# Patient Record
Sex: Female | Born: 1950 | Race: White | Hispanic: No | Smoking: Never smoker
Health system: Southern US, Community
[De-identification: ages and names within clinical notes are randomized; demographics above are authoritative.]

## PROBLEM LIST (undated history)

## (undated) DIAGNOSIS — R51 Headache: Secondary | ICD-10-CM

## (undated) DIAGNOSIS — R112 Nausea with vomiting, unspecified: Secondary | ICD-10-CM

## (undated) DIAGNOSIS — Z9889 Other specified postprocedural states: Secondary | ICD-10-CM

## (undated) DIAGNOSIS — Z1211 Encounter for screening for malignant neoplasm of colon: Secondary | ICD-10-CM

## (undated) DIAGNOSIS — L309 Dermatitis, unspecified: Secondary | ICD-10-CM

## (undated) DIAGNOSIS — R519 Headache, unspecified: Secondary | ICD-10-CM

## (undated) DIAGNOSIS — T8859XA Other complications of anesthesia, initial encounter: Secondary | ICD-10-CM

## (undated) DIAGNOSIS — Z1389 Encounter for screening for other disorder: Secondary | ICD-10-CM

## (undated) DIAGNOSIS — E559 Vitamin D deficiency, unspecified: Secondary | ICD-10-CM

## (undated) DIAGNOSIS — N6019 Diffuse cystic mastopathy of unspecified breast: Secondary | ICD-10-CM

## (undated) DIAGNOSIS — Z13220 Encounter for screening for lipoid disorders: Secondary | ICD-10-CM

## (undated) DIAGNOSIS — K649 Unspecified hemorrhoids: Secondary | ICD-10-CM

## (undated) DIAGNOSIS — I480 Paroxysmal atrial fibrillation: Secondary | ICD-10-CM

## (undated) DIAGNOSIS — R739 Hyperglycemia, unspecified: Secondary | ICD-10-CM

## (undated) DIAGNOSIS — N63 Unspecified lump in unspecified breast: Secondary | ICD-10-CM

## (undated) DIAGNOSIS — M1990 Unspecified osteoarthritis, unspecified site: Secondary | ICD-10-CM

## (undated) DIAGNOSIS — C801 Malignant (primary) neoplasm, unspecified: Secondary | ICD-10-CM

## (undated) DIAGNOSIS — K219 Gastro-esophageal reflux disease without esophagitis: Secondary | ICD-10-CM

## (undated) DIAGNOSIS — Z87440 Personal history of urinary (tract) infections: Secondary | ICD-10-CM

## (undated) DIAGNOSIS — R7303 Prediabetes: Secondary | ICD-10-CM

## (undated) DIAGNOSIS — I639 Cerebral infarction, unspecified: Secondary | ICD-10-CM

## (undated) DIAGNOSIS — G4730 Sleep apnea, unspecified: Secondary | ICD-10-CM

## (undated) DIAGNOSIS — E119 Type 2 diabetes mellitus without complications: Secondary | ICD-10-CM

## (undated) DIAGNOSIS — I10 Essential (primary) hypertension: Secondary | ICD-10-CM

## (undated) DIAGNOSIS — I499 Cardiac arrhythmia, unspecified: Secondary | ICD-10-CM

## (undated) DIAGNOSIS — D126 Benign neoplasm of colon, unspecified: Secondary | ICD-10-CM

## (undated) DIAGNOSIS — G43909 Migraine, unspecified, not intractable, without status migrainosus: Secondary | ICD-10-CM

## (undated) DIAGNOSIS — E785 Hyperlipidemia, unspecified: Secondary | ICD-10-CM

## (undated) DIAGNOSIS — Z803 Family history of malignant neoplasm of breast: Secondary | ICD-10-CM

## (undated) HISTORY — DX: Other specified postprocedural states: Z98.890

## (undated) HISTORY — DX: Gastro-esophageal reflux disease without esophagitis: K21.9

## (undated) HISTORY — DX: Essential (primary) hypertension: I10

## (undated) HISTORY — PX: CYSTECTOMY: SUR359

## (undated) HISTORY — DX: Encounter for screening for other disorder: Z13.89

## (undated) HISTORY — PX: BREAST BIOPSY: SHX20

## (undated) HISTORY — PX: LIPOMA EXCISION: SHX5283

## (undated) HISTORY — DX: Personal history of urinary (tract) infections: Z87.440

## (undated) HISTORY — DX: Family history of malignant neoplasm of breast: Z80.3

## (undated) HISTORY — DX: Diffuse cystic mastopathy of unspecified breast: N60.19

## (undated) HISTORY — PX: KNEE ARTHROSCOPY: SUR90

## (undated) HISTORY — PX: FOOT SURGERY: SHX648

## (undated) HISTORY — DX: Encounter for screening for malignant neoplasm of colon: Z12.11

## (undated) HISTORY — PX: BUNIONECTOMY: SHX129

## (undated) HISTORY — PX: WISDOM TOOTH EXTRACTION: SHX21

## (undated) HISTORY — PX: CYST REMOVAL NECK: SHX6281

## (undated) HISTORY — PX: BREAST SURGERY: SHX581

## (undated) HISTORY — DX: Encounter for screening for lipoid disorders: Z13.220

---
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
## 1988-04-18 DIAGNOSIS — I10 Essential (primary) hypertension: Secondary | ICD-10-CM

## 1988-04-18 HISTORY — DX: Essential (primary) hypertension: I10

## 2004-08-05 ENCOUNTER — Ambulatory Visit: Payer: Self-pay | Admitting: Internal Medicine

## 2005-04-18 HISTORY — PX: COLONOSCOPY: SHX174

## 2009-04-18 HISTORY — PX: BREAST BIOPSY: SHX20

## 2009-04-18 HISTORY — PX: KNEE SURGERY: SHX244

## 2010-03-05 ENCOUNTER — Ambulatory Visit: Payer: Self-pay | Admitting: Anesthesiology

## 2010-03-08 ENCOUNTER — Ambulatory Visit: Payer: Self-pay | Admitting: Orthopedic Surgery

## 2010-04-18 DIAGNOSIS — Z9889 Other specified postprocedural states: Secondary | ICD-10-CM

## 2010-04-18 HISTORY — DX: Other specified postprocedural states: Z98.890

## 2010-05-19 ENCOUNTER — Ambulatory Visit: Payer: Self-pay | Admitting: General Surgery

## 2010-11-08 ENCOUNTER — Ambulatory Visit: Payer: Self-pay | Admitting: General Surgery

## 2010-12-17 ENCOUNTER — Ambulatory Visit: Payer: Self-pay | Admitting: General Surgery

## 2010-12-21 LAB — PATHOLOGY REPORT

## 2011-08-12 ENCOUNTER — Ambulatory Visit: Payer: Self-pay | Admitting: General Surgery

## 2011-08-15 LAB — PATHOLOGY REPORT

## 2011-11-09 ENCOUNTER — Ambulatory Visit: Payer: Self-pay | Admitting: General Surgery

## 2012-06-08 ENCOUNTER — Encounter: Payer: Self-pay | Admitting: *Deleted

## 2012-06-08 DIAGNOSIS — I10 Essential (primary) hypertension: Secondary | ICD-10-CM | POA: Insufficient documentation

## 2012-07-26 ENCOUNTER — Ambulatory Visit (INDEPENDENT_AMBULATORY_CARE_PROVIDER_SITE_OTHER): Payer: BC Managed Care – PPO | Admitting: General Surgery

## 2012-07-26 ENCOUNTER — Encounter: Payer: Self-pay | Admitting: General Surgery

## 2012-07-26 VITALS — BP 104/66 | HR 62 | Resp 12 | Ht 63.0 in | Wt 237.0 lb

## 2012-07-26 DIAGNOSIS — K219 Gastro-esophageal reflux disease without esophagitis: Secondary | ICD-10-CM

## 2012-07-26 DIAGNOSIS — D131 Benign neoplasm of stomach: Secondary | ICD-10-CM | POA: Insufficient documentation

## 2012-07-26 DIAGNOSIS — Z803 Family history of malignant neoplasm of breast: Secondary | ICD-10-CM | POA: Insufficient documentation

## 2012-07-26 NOTE — Progress Notes (Signed)
Prior endoscopy has Patient ID: Jane Moran, female   DOB: 08-Jun-1950, 62 y.o.   MRN: [id number]  Chief Complaint  Patient presents with  . Other    gerd    HPI Jane Moran is a 62 y.o. female here today following up from gerd.No new problems.Prior endiscopy has shown multiple FGPs. Pt is doing ok with Zegerid at present HPI  Past Medical History  Diagnosis Date  . Hypertension 1990  . Esophageal reflux   . Diffuse cystic mastopathy   . Screening cholesterol level     Past Surgical History  Procedure Laterality Date  . Colonoscopy  2007  . Knee surgery Left 2011  . Bunionectomy  1972  . Cyst removal neck  4098,1191    Family History  Problem Relation Age of Onset  . Breast cancer Mother   . Lung cancer Father   . Prostate cancer Maternal Grandfather   . Breast cancer Paternal Aunt     Social History History  Substance Use Topics  . Smoking status: Never Smoker   . Smokeless tobacco: Never Used  . Alcohol Use: No    Allergies  Allergen Reactions  . Codeine Nausea Only  . Zinc Oxide Rash    Current Outpatient Prescriptions  Medication Sig Dispense Refill  . aspirin-acetaminophen-caffeine (EXCEDRIN MIGRAINE) 250-250-65 MG per tablet Take 1 tablet by mouth every 6 (six) hours as needed for pain.      . Cholecalciferol (D3 DOTS) 2000 UNITS TBDP Take by mouth.      . fenoprofen (NALFON) 600 MG TABS Take by mouth.      Marland Kitchen lisinopril-hydrochlorothiazide (PRINZIDE,ZESTORETIC) 20-25 MG per tablet Take 1 tablet by mouth daily.      Maxwell Caul Bicarbonate (ZEGERID) 20-1100 MG CAPS Take 1 capsule by mouth daily before breakfast.       No current facility-administered medications for this visit.    Review of Systems Review of Systems  Constitutional: Negative.   Respiratory: Negative.   Cardiovascular: Negative.     Blood pressure 104/66, pulse 62, resp. rate 12, height 5\' 3"  (1.6 m), weight 237 lb (107.502 kg).  Physical Exam Physical Exam   Constitutional: She appears well-developed and well-nourished.  Eyes: Conjunctivae are normal. No scleral icterus.  Neck: Trachea normal. No mass and no thyromegaly present.  Cardiovascular: Normal rate, regular rhythm, normal heart sounds and normal pulses.   No murmur heard. Pulmonary/Chest: Effort normal and breath sounds normal.  Abdominal: Soft. Normal appearance and bowel sounds are normal. There is no hepatosplenomegaly. There is no tenderness. No hernia.    Data Reviewed none  Assessment    GERD , stable and unde control with Zegerid     Plan    Continue with current med, discussed other measures to control heartburn.        Lalania Haseman G 07/27/2012, 6:15 AM

## 2012-07-26 NOTE — Patient Instructions (Addendum)
Patient to return for follow up in July 2014 after having bilateral screening mammogram. Continue use of Zegrid for reflux., keeping HO B elevated and avoid eating for 2-3 hrs prior to going to bed .

## 2012-07-27 ENCOUNTER — Encounter: Payer: Self-pay | Admitting: General Surgery

## 2012-08-27 ENCOUNTER — Encounter: Payer: Self-pay | Admitting: *Deleted

## 2012-11-09 ENCOUNTER — Ambulatory Visit: Payer: Self-pay | Admitting: General Surgery

## 2012-11-12 ENCOUNTER — Encounter: Payer: Self-pay | Admitting: General Surgery

## 2012-11-20 ENCOUNTER — Encounter: Payer: Self-pay | Admitting: General Surgery

## 2012-11-20 ENCOUNTER — Ambulatory Visit (INDEPENDENT_AMBULATORY_CARE_PROVIDER_SITE_OTHER): Payer: BC Managed Care – PPO | Admitting: General Surgery

## 2012-11-20 VITALS — BP 110/70 | HR 80 | Resp 16 | Ht 63.0 in | Wt 241.0 lb

## 2012-11-20 DIAGNOSIS — K219 Gastro-esophageal reflux disease without esophagitis: Secondary | ICD-10-CM

## 2012-11-20 DIAGNOSIS — N6019 Diffuse cystic mastopathy of unspecified breast: Secondary | ICD-10-CM

## 2012-11-20 DIAGNOSIS — D131 Benign neoplasm of stomach: Secondary | ICD-10-CM

## 2012-11-20 DIAGNOSIS — Z803 Family history of malignant neoplasm of breast: Secondary | ICD-10-CM

## 2012-11-20 NOTE — Progress Notes (Signed)
Patient ID: Jane Moran, female   DOB: Jan 25, 1951, 62 y.o.   MRN: [id number]  Chief Complaint  Patient presents with  . Follow-up    mammogram and GERD    HPI Jane Moran is a 62 y.o. female.  who presents for her annual breast evaluation. The most recent mammogram was done on 11-09-12.  Patient does perform regular self breast checks and gets regular mammograms done.  No new breast issues. She has been using the Zegerid and the reflux seems to be under control.   HPI  Past Medical History  Diagnosis Date  . Esophageal reflux   . Diffuse cystic mastopathy   . Screening cholesterol level   . Hypertension 1990  . H/O endoscopy 2012  . H/O cystitis   . Special screening for malignant neoplasms, colon   . Screening for obesity   . Family history of malignant neoplasm of breast     Past Surgical History  Procedure Laterality Date  . Colonoscopy  2007  . Knee surgery Left 2011  . Bunionectomy  1972  . Cyst removal neck  [id number]    Family History  Problem Relation Age of Onset  . Breast cancer Mother   . Lung cancer Father   . Prostate cancer Maternal Grandfather   . Breast cancer Paternal Aunt   . Breast cancer Cousin     Social History History  Substance Use Topics  . Smoking status: Never Smoker   . Smokeless tobacco: Never Used  . Alcohol Use: No    Allergies  Allergen Reactions  . Codeine Nausea Only  . Zinc Oxide Rash    Current Outpatient Prescriptions  Medication Sig Dispense Refill  . aspirin-acetaminophen-caffeine (EXCEDRIN MIGRAINE) 250-250-65 MG per tablet Take 1 tablet by mouth every 6 (six) hours as needed for pain.      . Cholecalciferol (VITAMIN D) 2000 UNITS CAPS Take 1 capsule by mouth daily.      Marland Kitchen lisinopril-hydrochlorothiazide (PRINZIDE,ZESTORETIC) 20-25 MG per tablet Take 1 tablet by mouth daily.      Maxwell Caul Bicarbonate (ZEGERID) 20-1100 MG CAPS Take 1 capsule by mouth daily before breakfast.       No current  facility-administered medications for this visit.    Review of Systems Review of Systems  Constitutional: Negative.   Respiratory: Negative.   Cardiovascular: Negative.     Blood pressure 110/70, pulse 80, resp. rate 16, height 5\' 3"  (1.6 m), weight 241 lb (109.317 kg).  Physical Exam Physical Exam  Constitutional: She is oriented to person, place, and time. She appears well-developed and well-nourished.  Eyes: Conjunctivae are normal.  Neck: Neck supple. No thyromegaly present.  Cardiovascular: Normal rate and regular rhythm.   Pulmonary/Chest: Effort normal and breath sounds normal. Right breast exhibits no inverted nipple, no mass, no nipple discharge, no skin change and no tenderness. Left breast exhibits no inverted nipple, no mass, no nipple discharge, no skin change and no tenderness.  Lymphadenopathy:    She has no cervical adenopathy.    She has no axillary adenopathy.  Neurological: She is alert and oriented to person, place, and time.  Skin: Skin is warm and dry.    Data Reviewed Mammogram  Assessment    Stable breast exam. GERD under control.    Plan    Patient to return in one year for bilateral screening mammogram. May use over the counter Nexium.       SANKAR,SEEPLAPUTHUR G 11/20/2012, 7:40 PM

## 2012-11-20 NOTE — Patient Instructions (Addendum)
Continue self breast exams. Call office for any new breast issues or concerns. Nexium is OK to use

## 2013-12-02 ENCOUNTER — Ambulatory Visit: Payer: BC Managed Care – PPO | Admitting: General Surgery

## 2013-12-10 ENCOUNTER — Ambulatory Visit: Payer: Self-pay | Admitting: General Surgery

## 2013-12-12 ENCOUNTER — Encounter: Payer: Self-pay | Admitting: General Surgery

## 2013-12-16 ENCOUNTER — Ambulatory Visit (INDEPENDENT_AMBULATORY_CARE_PROVIDER_SITE_OTHER): Payer: BC Managed Care – PPO | Admitting: General Surgery

## 2013-12-16 ENCOUNTER — Encounter: Payer: Self-pay | Admitting: General Surgery

## 2013-12-16 VITALS — BP 130/74 | HR 74 | Resp 14 | Ht 63.0 in | Wt 239.0 lb

## 2013-12-16 DIAGNOSIS — N6019 Diffuse cystic mastopathy of unspecified breast: Secondary | ICD-10-CM

## 2013-12-16 DIAGNOSIS — Z803 Family history of malignant neoplasm of breast: Secondary | ICD-10-CM

## 2013-12-16 NOTE — Progress Notes (Signed)
Patient ID: JAZZELLE ZHANG, female   DOB: 1950/10/30, 63 y.o.   MRN: [id number]  Chief Complaint  Patient presents with  . Follow-up    mammogram    HPI Jane Moran is a 63 y.o. female who presents for a breast evaluation. The most recent mammogram was done on 12/10/13.  Patient does perform regular self breast checks and gets regular mammograms done.  No breast complaints. She does have a small skin cyst in right axilla that has been bothersome.  HPI  Past Medical History  Diagnosis Date  . Esophageal reflux   . Diffuse cystic mastopathy   . Screening cholesterol level   . Hypertension 1990  . H/O endoscopy 2012  . H/O cystitis   . Special screening for malignant neoplasms, colon   . Screening for obesity   . Family history of malignant neoplasm of breast     Past Surgical History  Procedure Laterality Date  . Colonoscopy  2007  . Knee surgery Left 2011  . Bunionectomy  1972  . Cyst removal neck  1448,1856    Family History  Problem Relation Age of Onset  . Breast cancer Mother   . Lung cancer Father   . Prostate cancer Maternal Grandfather   . Breast cancer Paternal Aunt   . Breast cancer Cousin     Social History History  Substance Use Topics  . Smoking status: Never Smoker   . Smokeless tobacco: Never Used  . Alcohol Use: No    Allergies  Allergen Reactions  . Codeine Nausea Only  . Zinc Oxide Rash    Current Outpatient Prescriptions  Medication Sig Dispense Refill  . aspirin-acetaminophen-caffeine (EXCEDRIN MIGRAINE) 250-250-65 MG per tablet Take 1 tablet by mouth every 6 (six) hours as needed for pain.      . Cholecalciferol (VITAMIN D) 2000 UNITS CAPS Take 1 capsule by mouth daily. 400mg       . dexlansoprazole (DEXILANT) 60 MG capsule Take 60 mg by mouth daily.      Marland Kitchen lisinopril-hydrochlorothiazide (PRINZIDE,ZESTORETIC) 20-25 MG per tablet Take 1 tablet by mouth daily.       No current facility-administered medications for this visit.    Review  of Systems Review of Systems  Constitutional: Negative.   Respiratory: Negative.   Cardiovascular: Negative.     Blood pressure 130/74, pulse 74, resp. rate 14, height 5\' 3"  (1.6 m), weight 239 lb (108.41 kg).  Physical Exam Physical Exam  Constitutional: She is oriented to person, place, and time. She appears well-developed and well-nourished.  Eyes: Conjunctivae are normal. No scleral icterus.  Neck: Neck supple. No mass and no thyromegaly present.  Cardiovascular: Normal rate, regular rhythm and normal heart sounds.   Pulmonary/Chest: Effort normal and breath sounds normal. Right breast exhibits no inverted nipple, no mass, no nipple discharge, no skin change and no tenderness. Left breast exhibits no inverted nipple, no mass, no nipple discharge, no skin change and no tenderness.  Abdominal: Soft. Bowel sounds are normal. There is no tenderness.  Lymphadenopathy:    She has no cervical adenopathy.    She has no axillary adenopathy.  Neurological: She is alert and oriented to person, place, and time.  Skin: Skin is warm and dry.  6 mm skin cyst in the right axilla.     Data Reviewed Mammogram reviewed and stable.   Assessment    Stable exam. FH of breast cancer     Plan    The patient has been asked to  return to the office in one year with a bilateral diagnostic mammogram.     Schedule excision of skin cyst right axilla    Iren Whipp G 12/17/2013, 4:22 PM

## 2013-12-17 ENCOUNTER — Encounter: Payer: Self-pay | Admitting: General Surgery

## 2013-12-17 NOTE — Patient Instructions (Signed)
Continue monthly self breast exam. Call for any problems.

## 2013-12-30 ENCOUNTER — Ambulatory Visit: Payer: BC Managed Care – PPO | Admitting: General Surgery

## 2014-01-07 ENCOUNTER — Ambulatory Visit (INDEPENDENT_AMBULATORY_CARE_PROVIDER_SITE_OTHER): Payer: BC Managed Care – PPO | Admitting: General Surgery

## 2014-01-07 ENCOUNTER — Encounter: Payer: Self-pay | Admitting: General Surgery

## 2014-01-07 VITALS — BP 118/78 | HR 90 | Resp 12 | Ht 62.0 in | Wt 237.0 lb

## 2014-01-07 DIAGNOSIS — R229 Localized swelling, mass and lump, unspecified: Secondary | ICD-10-CM

## 2014-01-07 DIAGNOSIS — R2231 Localized swelling, mass and lump, right upper limb: Secondary | ICD-10-CM

## 2014-01-07 NOTE — Progress Notes (Signed)
Here today for planned excision right axilla skin cyst.  Area infiltrated with 6 ml of 1% xylocaine mixed with 0.5% marcaine. Prepped with Chloro prep. Elliptical excision of the cyst performed. Excised diameter 2cm. leeding controlled with cautery. Skin closed with 4-0 nylon interrupted stitches. No immediate problems from procedure. Marland Kitchen

## 2014-01-07 NOTE — Patient Instructions (Addendum)
Keep area clean May use ice to the area on and off today

## 2014-01-09 ENCOUNTER — Telehealth: Payer: Self-pay | Admitting: *Deleted

## 2014-01-09 LAB — PATHOLOGY

## 2014-01-09 NOTE — Telephone Encounter (Signed)
Notified patient husband as instructed, patient husband pleased. Discussed follow-up appointments, patient husband agrees

## 2014-01-14 ENCOUNTER — Ambulatory Visit (INDEPENDENT_AMBULATORY_CARE_PROVIDER_SITE_OTHER): Payer: Self-pay | Admitting: *Deleted

## 2014-01-14 DIAGNOSIS — R2231 Localized swelling, mass and lump, right upper limb: Secondary | ICD-10-CM

## 2014-01-14 DIAGNOSIS — R229 Localized swelling, mass and lump, unspecified: Secondary | ICD-10-CM

## 2014-01-14 NOTE — Patient Instructions (Signed)
Patient to return as needed. The patient is aware to call back for any questions or concerns. 

## 2014-01-14 NOTE — Progress Notes (Signed)
The sutures were removed and steri strips applied. Patient aware of pathology results.   

## 2014-02-17 ENCOUNTER — Encounter: Payer: Self-pay | Admitting: General Surgery

## 2014-09-18 ENCOUNTER — Other Ambulatory Visit: Payer: Self-pay | Admitting: Internal Medicine

## 2014-09-18 DIAGNOSIS — Z1231 Encounter for screening mammogram for malignant neoplasm of breast: Secondary | ICD-10-CM

## 2014-11-12 ENCOUNTER — Encounter: Payer: Self-pay | Admitting: *Deleted

## 2014-11-13 ENCOUNTER — Encounter
Admission: RE | Disposition: A | Discharge status: Home / Self Care | Payer: Self-pay | Source: Ambulatory Visit | Attending: Gastroenterology

## 2014-11-13 ENCOUNTER — Ambulatory Visit: Payer: BLUE CROSS/BLUE SHIELD | Admitting: Anesthesiology

## 2014-11-13 ENCOUNTER — Ambulatory Visit
Admission: RE | Admit: 2014-11-13 | Discharge: 2014-11-13 | Disposition: A | Discharge status: Home / Self Care | Payer: BLUE CROSS/BLUE SHIELD | Source: Ambulatory Visit | Attending: Gastroenterology | Admitting: Gastroenterology

## 2014-11-13 DIAGNOSIS — Z1211 Encounter for screening for malignant neoplasm of colon: Secondary | ICD-10-CM | POA: Insufficient documentation

## 2014-11-13 DIAGNOSIS — D125 Benign neoplasm of sigmoid colon: Secondary | ICD-10-CM | POA: Diagnosis not present

## 2014-11-13 DIAGNOSIS — K219 Gastro-esophageal reflux disease without esophagitis: Secondary | ICD-10-CM | POA: Insufficient documentation

## 2014-11-13 DIAGNOSIS — K648 Other hemorrhoids: Secondary | ICD-10-CM | POA: Insufficient documentation

## 2014-11-13 DIAGNOSIS — Z8371 Family history of colonic polyps: Secondary | ICD-10-CM | POA: Diagnosis not present

## 2014-11-13 DIAGNOSIS — I10 Essential (primary) hypertension: Secondary | ICD-10-CM | POA: Diagnosis not present

## 2014-11-13 HISTORY — DX: Unspecified osteoarthritis, unspecified site: M19.90

## 2014-11-13 HISTORY — PX: COLONOSCOPY WITH PROPOFOL: SHX5780

## 2014-11-13 HISTORY — DX: Headache, unspecified: R51.9

## 2014-11-13 HISTORY — DX: Cardiac arrhythmia, unspecified: I49.9

## 2014-11-13 HISTORY — DX: Headache: R51

## 2014-11-13 SURGERY — COLONOSCOPY WITH PROPOFOL
Anesthesia: General

## 2014-11-13 MED ORDER — MIDAZOLAM HCL 5 MG/5ML IJ SOLN
INTRAMUSCULAR | Status: DC | PRN
  Administered 2014-11-13: 2 mg via INTRAVENOUS

## 2014-11-13 MED ORDER — PROPOFOL INFUSION 10 MG/ML OPTIME
INTRAVENOUS | Status: DC | PRN
  Administered 2014-11-13: 160 ug/kg/min via INTRAVENOUS

## 2014-11-13 MED ORDER — SODIUM CHLORIDE 0.9 % IV SOLN: INTRAVENOUS | Status: DC

## 2014-11-13 MED ORDER — PROPOFOL 10 MG/ML IV BOLUS
INTRAVENOUS | Status: DC | PRN
  Administered 2014-11-13 (×2): 40 mg via INTRAVENOUS

## 2014-11-13 MED ORDER — SODIUM CHLORIDE 0.9 % IV SOLN
INTRAVENOUS | Status: DC
  Administered 2014-11-13: 09:00:00 via INTRAVENOUS
  Administered 2014-11-13: 1000 mL via INTRAVENOUS

## 2014-11-13 MED ORDER — FENTANYL CITRATE (PF) 100 MCG/2ML IJ SOLN
INTRAMUSCULAR | Status: DC | PRN
  Administered 2014-11-13: 50 ug via INTRAVENOUS

## 2014-11-13 NOTE — Anesthesia Postprocedure Evaluation (Signed)
  Anesthesia Post-op Note  Patient: Jane Moran  Procedure(s) Performed: Procedure(s): COLONOSCOPY WITH PROPOFOL (N/A)  Anesthesia type:General  Patient location: PACU  Post pain: Pain level controlled  Post assessment: Post-op Vital signs reviewed, Patient's Cardiovascular Status Stable, Respiratory Function Stable, Patent Airway and No signs of Nausea or vomiting  Post vital signs: Reviewed and stable  Last Vitals:  Filed Vitals:   11/13/14 1012  BP: 110/67  Pulse: 62  Temp:   Resp: 14    Level of consciousness: awake, alert  and patient cooperative  Complications: No apparent anesthesia complications

## 2014-11-13 NOTE — Discharge Instructions (Signed)
YOU HAD AN ENDOSCOPIC PROCEDURE TODAY: Refer to the procedure report that was given to you for any specific questions about what was found during the examination.  If the procedure report does not answer your questions, please call your gastroenterologist to clarify. ° °YOU SHOULD EXPECT: Some feelings of bloating in the abdomen. Passage of more gas than usual.  Walking can help get rid of the air that was put into your GI tract during the procedure and reduce the bloating. If you had a lower endoscopy (such as a colonoscopy or flexible sigmoidoscopy) you may notice spotting of blood in your stool or on the toilet paper.  ° °DIET: Your first meal following the procedure should be a Wegmann meal and then it is ok to progress to your normal diet.  A half-sandwich or bowl of soup is an example of a good first meal.  Heavy or fried foods are harder to digest and may make you feel nasueas or bloated.  Drink plenty of fluids but you should avoid alcoholic beverages for 24 hours. ° °ACTIVITY: Your care partner should take you home directly after the procedure.  You should plan to take it easy, moving slowly for the rest of the day.  You can resume normal activity the day after the procedure however you should NOT DRIVE or use heavy machinery for 24 hours (because of the sedation medicines used during the test).   ° °SYMPTOMS TO REPORT IMMEDIATELY  °A gastroenterologist can be reached at any hour.  Please call your doctor's office for any of the following symptoms: ° °· Following lower endoscopy (colonoscopy, flexible sigmoidoscopy) ° Excessive amounts of blood in the stool ° Significant tenderness, worsening of abdominal pains ° Swelling of the abdomen that is new, acute ° Fever of 100° or higher °· Following upper endoscopy (EGD, EUS, ERCP) ° Vomiting of blood or coffee ground material ° New, significant abdominal pain ° New, significant chest pain or pain under the shoulder blades ° Painful or persistently difficult  swallowing ° New shortness of breath ° Black, tarry-looking stools ° °FOLLOW UP: °If any biopsies were taken you will be contacted by phone or by letter within the next 1-3 weeks.  Call your gastroenterologist if you have not heard about the biopsies in 3 weeks.  °Please also call your gastroenterologist's office with any specific questions about appointments or follow up tests. ° °

## 2014-11-13 NOTE — Op Note (Signed)
[facility] Gastroenterology Patient Name: Jane Moran Procedure Date: 11/13/2014 9:08 AM MRN: [id number] Account #: [id number] Date of Birth: 16-May-1950 Admit Type: Outpatient Age: 64 Room: [facility] ENDO ROOM 3 Gender: Female Note Status: Finalized Procedure:         Colonoscopy Indications:       Colon cancer screening in patient at increased risk:                     Family history of colon polyps, Last colonoscopy 10 years                     ago Patient Profile:   This is a 64 year old female. Providers:         Gerrit Heck. Rayann Heman, MD Referring MD:      Ramonita Lab, MD (Referring MD) Medicines:         Propofol per Anesthesia Complications:     No immediate complications. Procedure:         Pre-Anesthesia Assessment:                    - Prior to the procedure, a History and Physical was                     performed, and patient medications, allergies and                     sensitivities were reviewed. The patient's tolerance of                     previous anesthesia was reviewed.                    After obtaining informed consent, the colonoscope was                     passed under direct vision. Throughout the procedure, the                     patient's blood pressure, pulse, and oxygen saturations                     were monitored continuously. The Colonoscope was                     introduced through the anus and advanced to the the cecum,                     identified by appendiceal orifice and ileocecal valve. The                     colonoscopy was performed without difficulty. The patient                     tolerated the procedure well. The quality of the bowel                     preparation was excellent. Findings:      The perianal and digital rectal examinations were normal.      A 12 mm polyp was found in the sigmoid colon. The polyp was       pedunculated. The polyp was removed with a hot snare. Resection and       retrieval were  complete.      Internal hemorrhoids were found during retroflexion.  The hemorrhoids       were Grade I (internal hemorrhoids that do not prolapse).      The exam was otherwise without abnormality. Impression:        - One 12 mm polyp in the sigmoid colon. Resected and                     retrieved.                    - Internal hemorrhoids.                    - The examination was otherwise normal. Recommendation:    - Observe patient in GI recovery unit.                    - High fiber diet.                    - Continue present medications.                    - Await pathology results.                    - Repeat colonoscopy for surveillance based on pathology                     results.                    - Return to referring physician.                    - The findings and recommendations were discussed with the                     patient.                    - The findings and recommendations were discussed with the                     patient's family. Procedure Code(s): --- Professional ---                    [phone], Colonoscopy, flexible; with removal of tumor(s),                     polyp(s), or other lesion(s) by snare technique CPT copyright 2014 [employer] Association. All rights reserved. The codes documented in this report are preliminary and upon coder review may  be revised to meet current compliance requirements. Mellody Life, MD 11/13/2014 9:45:16 AM This report has been signed electronically. Number of Addenda: 0 Note Initiated On: 11/13/2014 9:08 AM Scope Withdrawal Time: 0 hours 15 minutes 0 seconds  Total Procedure Duration: 0 hours 29 minutes 28 seconds       [facility]

## 2014-11-13 NOTE — Anesthesia Procedure Notes (Signed)
Date/Time: 11/13/2014 9:00 AM Performed by: Allean Found Pre-anesthesia Checklist: Patient identified, Emergency Drugs available, Suction available, Patient being monitored and Timeout performed Patient Re-evaluated:Patient Re-evaluated prior to inductionOxygen Delivery Method: Nasal cannula Preoxygenation: Pre-oxygenation with 100% oxygen Intubation Type: IV induction

## 2014-11-13 NOTE — Anesthesia Preprocedure Evaluation (Signed)
Anesthesia Evaluation  Patient identified by MRN, date of birth, ID band Patient awake    Reviewed: Allergy & Precautions, NPO status , Patient's Chart, lab work & pertinent test results  History of Anesthesia Complications (+) PONV  Airway Mallampati: III  TM Distance: >3 FB Neck ROM: Full    Dental  (+) Teeth Intact   Pulmonary          Cardiovascular hypertension, Pt. on medications + dysrhythmias (Occ palpitations)     Neuro/Psych  Headaches,    GI/Hepatic GERD-  Medicated and Poorly Controlled,  Endo/Other  Morbid obesity  Renal/GU      Musculoskeletal  (+) Arthritis -, Osteoarthritis,    Abdominal   Peds  Hematology   Anesthesia Other Findings   Reproductive/Obstetrics                             Anesthesia Physical Anesthesia Plan  ASA: III  Anesthesia Plan: General   Post-op Pain Management:    Induction: Intravenous  Airway Management Planned: Nasal Cannula  Additional Equipment:   Intra-op Plan:   Post-operative Plan:   Informed Consent: I have reviewed the patients History and Physical, chart, labs and discussed the procedure including the risks, benefits and alternatives for the proposed anesthesia with the patient or authorized representative who has indicated his/her understanding and acceptance.     Plan Discussed with:   Anesthesia Plan Comments:         Anesthesia Quick Evaluation

## 2014-11-13 NOTE — Transfer of Care (Signed)
Immediate Anesthesia Transfer of Care Note  Patient: Jane Moran  Procedure(s) Performed: Procedure(s): COLONOSCOPY WITH PROPOFOL (N/A)  Patient Location: PACU  Anesthesia Type:General  Level of Consciousness: sedated  Airway & Oxygen Therapy: Patient Spontanous Breathing and Patient connected to nasal cannula oxygen  Post-op Assessment: Report given to RN and Post -op Vital signs reviewed and stable  Post vital signs: Reviewed and stable  Last Vitals:  Filed Vitals:   11/13/14 0802  BP: 134/70  Pulse: 69  Temp: 63.8 C    Complications: No apparent anesthesia complications

## 2014-11-13 NOTE — H&P (Signed)
  Primary Care Physician:  Adin Hector, MD  Pre-Procedure History & Physical: HPI:  Jane Moran is a 64 y.o. female is here for an colonoscopy.   Past Medical History  Diagnosis Date  . Esophageal reflux   . Diffuse cystic mastopathy   . Screening cholesterol level   . Hypertension 1990  . H/O endoscopy 2012  . H/O cystitis   . Special screening for malignant neoplasms, colon   . Screening for obesity   . Family history of malignant neoplasm of breast   . Arthritis   . Headache     migranes  . Dysrhythmia     Past Surgical History  Procedure Laterality Date  . Colonoscopy  2007  . Knee surgery Left 2011  . Bunionectomy  1972  . Cyst removal neck  [id number]  . Breast surgery      breast biopsy    Prior to Admission medications   Medication Sig Start Date End Date Taking? Authorizing Provider  triamcinolone cream (KENALOG) 0.5 % Apply 1 application topically 3 (three) times daily.   Yes Historical Provider, MD  aspirin-acetaminophen-caffeine (EXCEDRIN MIGRAINE) [phone] MG per tablet Take 1 tablet by mouth every 6 (six) hours as needed for pain.    Historical Provider, MD  Cholecalciferol (VITAMIN D) 2000 UNITS CAPS Take 1 capsule by mouth daily. 400mg     Historical Provider, MD  dexlansoprazole (DEXILANT) 60 MG capsule Take 60 mg by mouth daily.    Historical Provider, MD  lisinopril-hydrochlorothiazide (PRINZIDE,ZESTORETIC) 20-25 MG per tablet Take 1 tablet by mouth daily.    Historical Provider, MD    Allergies as of 10/23/2014 - Review Complete 01/07/2014  Allergen Reaction Noted  . Codeine Nausea Only 06/08/2012  . Zinc oxide Rash 06/08/2012    Family History  Problem Relation Age of Onset  . Breast cancer Mother   . Lung cancer Father   . Prostate cancer Maternal Grandfather   . Breast cancer Paternal Aunt   . Breast cancer Cousin     History   Social History  . Marital Status: Married    Spouse Name: N/A  . Number of Children: N/A  . Years  of Education: N/A   Occupational History  . Not on file.   Social History Main Topics  . Smoking status: Never Smoker   . Smokeless tobacco: Never Used  . Alcohol Use: No  . Drug Use: No  . Sexual Activity: Not on file   Other Topics Concern  . Not on file   Social History Narrative     Physical Exam: BP 134/70 mmHg  Pulse 69  Temp(Src) 97.9 F (36.6 C) (Tympanic)  Ht 5\' 2"  (1.575 m)  Wt 111.131 kg (245 lb)  BMI 44.80 kg/m2  SpO2 98% General:   Alert,  pleasant and cooperative in NAD Head:  Normocephalic and atraumatic. Neck:  Supple; no masses or thyromegaly. Lungs:  Clear throughout to auscultation.    Heart:  Regular rate and rhythm. Abdomen:  Soft, nontender and nondistended. Normal bowel sounds, without guarding, and without rebound.   Neurologic:  Alert and  oriented x4;  grossly normal neurologically.  Impression/Plan: Jane Moran is here for an colonoscopy to be performed for screening, fam hx polyps  Risks, benefits, limitations, and alternatives regarding  colonoscopy have been reviewed with the patient.  Questions have been answered.  All parties agreeable.   Jane Class, MD  11/13/2014, 9:10 AM

## 2014-11-14 ENCOUNTER — Encounter: Payer: Self-pay | Admitting: Gastroenterology

## 2014-11-14 LAB — SURGICAL PATHOLOGY

## 2014-12-12 ENCOUNTER — Ambulatory Visit
Admission: RE | Admit: 2014-12-12 | Discharge: 2014-12-12 | Disposition: A | Discharge status: Home / Self Care | Payer: BLUE CROSS/BLUE SHIELD | Source: Ambulatory Visit | Attending: Internal Medicine | Admitting: Internal Medicine

## 2014-12-12 DIAGNOSIS — Z1231 Encounter for screening mammogram for malignant neoplasm of breast: Secondary | ICD-10-CM | POA: Insufficient documentation

## 2014-12-24 ENCOUNTER — Ambulatory Visit: Payer: Self-pay | Admitting: General Surgery

## 2015-02-04 ENCOUNTER — Encounter: Payer: Self-pay | Admitting: *Deleted

## 2015-07-23 ENCOUNTER — Other Ambulatory Visit: Payer: Self-pay | Admitting: Orthopedic Surgery

## 2015-07-23 DIAGNOSIS — M4726 Other spondylosis with radiculopathy, lumbar region: Secondary | ICD-10-CM

## 2015-08-07 ENCOUNTER — Ambulatory Visit
Admission: RE | Admit: 2015-08-07 | Discharge: 2015-08-07 | Disposition: A | Discharge status: Home / Self Care | Payer: BLUE CROSS/BLUE SHIELD | Source: Ambulatory Visit | Attending: Orthopedic Surgery | Admitting: Orthopedic Surgery

## 2015-08-07 DIAGNOSIS — M4726 Other spondylosis with radiculopathy, lumbar region: Secondary | ICD-10-CM

## 2015-08-07 DIAGNOSIS — M4806 Spinal stenosis, lumbar region: Secondary | ICD-10-CM | POA: Insufficient documentation

## 2015-08-07 DIAGNOSIS — M5127 Other intervertebral disc displacement, lumbosacral region: Secondary | ICD-10-CM | POA: Insufficient documentation

## 2015-09-24 ENCOUNTER — Other Ambulatory Visit: Payer: Self-pay | Admitting: Internal Medicine

## 2015-09-24 DIAGNOSIS — Z1231 Encounter for screening mammogram for malignant neoplasm of breast: Secondary | ICD-10-CM

## 2015-12-14 ENCOUNTER — Other Ambulatory Visit: Payer: Self-pay | Admitting: Internal Medicine

## 2015-12-14 ENCOUNTER — Ambulatory Visit
Admission: RE | Admit: 2015-12-14 | Discharge: 2015-12-14 | Disposition: A | Discharge status: Home / Self Care | Payer: BLUE CROSS/BLUE SHIELD | Source: Ambulatory Visit | Attending: Internal Medicine | Admitting: Internal Medicine

## 2015-12-14 DIAGNOSIS — Z1231 Encounter for screening mammogram for malignant neoplasm of breast: Secondary | ICD-10-CM | POA: Diagnosis not present

## 2015-12-14 HISTORY — DX: Malignant (primary) neoplasm, unspecified: C80.1

## 2016-10-20 ENCOUNTER — Other Ambulatory Visit: Payer: Self-pay | Admitting: Internal Medicine

## 2016-10-20 DIAGNOSIS — Z1231 Encounter for screening mammogram for malignant neoplasm of breast: Secondary | ICD-10-CM

## 2016-11-03 ENCOUNTER — Other Ambulatory Visit: Payer: Self-pay | Admitting: Student

## 2016-11-03 DIAGNOSIS — K219 Gastro-esophageal reflux disease without esophagitis: Secondary | ICD-10-CM

## 2016-11-03 DIAGNOSIS — R112 Nausea with vomiting, unspecified: Secondary | ICD-10-CM

## 2016-11-07 ENCOUNTER — Ambulatory Visit
Admission: RE | Admit: 2016-11-07 | Discharge: 2016-11-07 | Disposition: A | Discharge status: Home / Self Care | Payer: BLUE CROSS/BLUE SHIELD | Source: Ambulatory Visit | Attending: Student | Admitting: Student

## 2016-11-07 DIAGNOSIS — R112 Nausea with vomiting, unspecified: Secondary | ICD-10-CM | POA: Insufficient documentation

## 2016-11-07 DIAGNOSIS — K219 Gastro-esophageal reflux disease without esophagitis: Secondary | ICD-10-CM | POA: Diagnosis present

## 2016-11-07 DIAGNOSIS — K449 Diaphragmatic hernia without obstruction or gangrene: Secondary | ICD-10-CM | POA: Diagnosis not present

## 2016-12-14 ENCOUNTER — Ambulatory Visit
Admission: RE | Admit: 2016-12-14 | Discharge: 2016-12-14 | Disposition: A | Discharge status: Home / Self Care | Payer: BLUE CROSS/BLUE SHIELD | Source: Ambulatory Visit | Attending: Internal Medicine | Admitting: Internal Medicine

## 2016-12-14 DIAGNOSIS — Z1231 Encounter for screening mammogram for malignant neoplasm of breast: Secondary | ICD-10-CM | POA: Insufficient documentation

## 2016-12-15 ENCOUNTER — Encounter: Payer: Self-pay | Admitting: *Deleted

## 2016-12-16 ENCOUNTER — Ambulatory Visit: Payer: BLUE CROSS/BLUE SHIELD | Admitting: Anesthesiology

## 2016-12-16 ENCOUNTER — Encounter: Payer: Self-pay | Admitting: *Deleted

## 2016-12-16 ENCOUNTER — Encounter
Admission: RE | Disposition: A | Discharge status: Home / Self Care | Payer: Self-pay | Source: Ambulatory Visit | Attending: Unknown Physician Specialty

## 2016-12-16 ENCOUNTER — Ambulatory Visit
Admission: RE | Admit: 2016-12-16 | Discharge: 2016-12-16 | Disposition: A | Discharge status: Home / Self Care | Payer: BLUE CROSS/BLUE SHIELD | Source: Ambulatory Visit | Attending: Unknown Physician Specialty | Admitting: Unknown Physician Specialty

## 2016-12-16 DIAGNOSIS — K317 Polyp of stomach and duodenum: Secondary | ICD-10-CM | POA: Insufficient documentation

## 2016-12-16 DIAGNOSIS — Z7982 Long term (current) use of aspirin: Secondary | ICD-10-CM | POA: Insufficient documentation

## 2016-12-16 DIAGNOSIS — Z7984 Long term (current) use of oral hypoglycemic drugs: Secondary | ICD-10-CM | POA: Diagnosis not present

## 2016-12-16 DIAGNOSIS — Z79899 Other long term (current) drug therapy: Secondary | ICD-10-CM | POA: Diagnosis not present

## 2016-12-16 DIAGNOSIS — K449 Diaphragmatic hernia without obstruction or gangrene: Secondary | ICD-10-CM | POA: Insufficient documentation

## 2016-12-16 DIAGNOSIS — R1310 Dysphagia, unspecified: Secondary | ICD-10-CM | POA: Diagnosis present

## 2016-12-16 DIAGNOSIS — R7303 Prediabetes: Secondary | ICD-10-CM | POA: Diagnosis not present

## 2016-12-16 DIAGNOSIS — K210 Gastro-esophageal reflux disease with esophagitis: Secondary | ICD-10-CM | POA: Insufficient documentation

## 2016-12-16 DIAGNOSIS — I10 Essential (primary) hypertension: Secondary | ICD-10-CM | POA: Diagnosis not present

## 2016-12-16 HISTORY — DX: Prediabetes: R73.03

## 2016-12-16 HISTORY — PX: ESOPHAGOGASTRODUODENOSCOPY (EGD) WITH PROPOFOL: SHX5813

## 2016-12-16 LAB — GLUCOSE, CAPILLARY: GLUCOSE-CAPILLARY: 111 mg/dL — AB (ref 65–99)

## 2016-12-16 SURGERY — ESOPHAGOGASTRODUODENOSCOPY (EGD) WITH PROPOFOL
Anesthesia: General

## 2016-12-16 MED ORDER — GLYCOPYRROLATE 0.2 MG/ML IJ SOLN
INTRAMUSCULAR | Status: DC | PRN
  Administered 2016-12-16: .2 mg via INTRAVENOUS

## 2016-12-16 MED ORDER — MIDAZOLAM HCL 2 MG/2ML IJ SOLN
INTRAMUSCULAR | Status: AC
  Filled 2016-12-16: qty 2

## 2016-12-16 MED ORDER — FENTANYL CITRATE (PF) 100 MCG/2ML IJ SOLN
INTRAMUSCULAR | Status: AC
  Filled 2016-12-16: qty 2

## 2016-12-16 MED ORDER — SODIUM CHLORIDE 0.9 % IV SOLN
INTRAVENOUS | Status: DC
  Administered 2016-12-16: 09:00:00 via INTRAVENOUS

## 2016-12-16 MED ORDER — MIDAZOLAM HCL 2 MG/2ML IJ SOLN
INTRAMUSCULAR | Status: DC | PRN
  Administered 2016-12-16: 1 mg via INTRAVENOUS

## 2016-12-16 MED ORDER — PROPOFOL 500 MG/50ML IV EMUL
INTRAVENOUS | Status: DC | PRN
  Administered 2016-12-16: 140 ug/kg/min via INTRAVENOUS

## 2016-12-16 MED ORDER — FENTANYL CITRATE (PF) 100 MCG/2ML IJ SOLN
INTRAMUSCULAR | Status: DC | PRN
  Administered 2016-12-16: 50 ug via INTRAVENOUS

## 2016-12-16 MED ORDER — LIDOCAINE HCL (CARDIAC) 20 MG/ML IV SOLN
INTRAVENOUS | Status: DC | PRN
  Administered 2016-12-16: 40 mg via INTRAVENOUS

## 2016-12-16 MED ORDER — SODIUM CHLORIDE 0.9 % IV SOLN: INTRAVENOUS | Status: DC

## 2016-12-16 MED ORDER — PROPOFOL 10 MG/ML IV BOLUS
INTRAVENOUS | Status: DC | PRN
  Administered 2016-12-16: 80 mg via INTRAVENOUS
  Administered 2016-12-16: 20 mg via INTRAVENOUS

## 2016-12-16 MED ORDER — LIDOCAINE HCL (PF) 2 % IJ SOLN
INTRAMUSCULAR | Status: AC
  Filled 2016-12-16: qty 2

## 2016-12-16 MED ORDER — PROPOFOL 500 MG/50ML IV EMUL
INTRAVENOUS | Status: AC
  Filled 2016-12-16: qty 50

## 2016-12-16 NOTE — Anesthesia Preprocedure Evaluation (Signed)
Anesthesia Evaluation  Patient identified by MRN, date of birth, ID band Patient awake    Reviewed: Allergy & Precautions, H&P , NPO status , Patient's Chart, lab work & pertinent test results  History of Anesthesia Complications Negative for: history of anesthetic complications  Airway Mallampati: III  TM Distance: <3 FB Neck ROM: limited    Dental  (+) Chipped   Pulmonary neg pulmonary ROS, neg shortness of breath,           Cardiovascular Exercise Tolerance: Good hypertension, (-) angina(-) Past MI and (-) DOE + dysrhythmias      Neuro/Psych  Headaches, negative psych ROS   GI/Hepatic Neg liver ROS, GERD  Medicated and Controlled,  Endo/Other  negative endocrine ROS  Renal/GU negative Renal ROS  negative genitourinary   Musculoskeletal  (+) Arthritis ,   Abdominal   Peds  Hematology negative hematology ROS (+)   Anesthesia Other Findings Past Medical History: No date: Arthritis No date: Cancer ([place])     Comment:  skin No date: Diffuse cystic mastopathy No date: Dysrhythmia No date: Esophageal reflux No date: Family history of malignant neoplasm of breast No date: H/O cystitis 2012: H/O endoscopy No date: Headache     Comment:  migranes 1990: Hypertension No date: Screening cholesterol level No date: Screening for obesity No date: Special screening for malignant neoplasms, colon  Past Surgical History: 2011: BREAST BIOPSY; Left     Comment:  benign biopsy with Dr. Jamal Collin No date: BREAST SURGERY     Comment:  breast biopsy 1972: BUNIONECTOMY 2007: COLONOSCOPY 11/13/2014: COLONOSCOPY WITH PROPOFOL; N/A     Comment:  Procedure: COLONOSCOPY WITH PROPOFOL;  Surgeon: Josefine Class, MD;  Location: [facility] ENDOSCOPY;  Service:               Endoscopy;  Laterality: N/A; 1308,6578: CYST REMOVAL NECK 2011: KNEE SURGERY; Left     Reproductive/Obstetrics negative OB ROS                              Anesthesia Physical Anesthesia Plan  ASA: III  Anesthesia Plan: General   Post-op Pain Management:    Induction: Intravenous  PONV Risk Score and Plan: Propofol infusion  Airway Management Planned: Natural Airway and Nasal Cannula  Additional Equipment:   Intra-op Plan:   Post-operative Plan:   Informed Consent: I have reviewed the patients History and Physical, chart, labs and discussed the procedure including the risks, benefits and alternatives for the proposed anesthesia with the patient or authorized representative who has indicated his/her understanding and acceptance.   Dental Advisory Given  Plan Discussed with: Anesthesiologist, CRNA and Surgeon  Anesthesia Plan Comments: (Patient consented for risks of anesthesia including but not limited to:  - adverse reactions to medications - risk of intubation if required - damage to teeth, lips or other oral mucosa - sore throat or hoarseness - Damage to heart, brain, lungs or loss of life  Patient voiced understanding.)        Anesthesia Quick Evaluation

## 2016-12-16 NOTE — Op Note (Signed)
[facility] Gastroenterology Patient Name: Jane Moran Procedure Date: 12/16/2016 8:58 AM MRN: [id number] Account #: [id number] Date of Birth: [birth year] Admit Type: Outpatient Age: 66 Room: [facility] ENDO ROOM 1 Gender: Female Note Status: Finalized Procedure:            Upper GI endoscopy Indications:          Dysphagia, Heartburn Providers:            Manya Silvas, MD Referring MD:         Ramonita Lab, MD (Referring MD) Medicines:            Propofol per Anesthesia Complications:        No immediate complications. Procedure:            Pre-Anesthesia Assessment:                       - After reviewing the risks and benefits, the patient                        was deemed in satisfactory condition to undergo the                        procedure.                       After obtaining informed consent, the endoscope was                        passed under direct vision. Throughout the procedure,                        the patient's blood pressure, pulse, and oxygen                        saturations were monitored continuously. The Endoscope                        was introduced through the mouth, and advanced to the                        second part of duodenum. The upper GI endoscopy was                        accomplished without difficulty. The patient tolerated                        the procedure well. Findings:      The Z-line was irregular and was found 31 cm from the incisors. Biopsies       were taken with a cold forceps for histology.      Multiple large pedunculated and sessile polyps with no bleeding and no       stigmata of recent bleeding were found in the gastric body. Biopsies       were taken with a cold forceps for histology.      A medium-sized hiatal hernia was present.      The gastric antrum was normal.      The examined duodenum was normal. Impression:           - Z-line irregular, 31 cm from the incisors. Biopsied.  - Multiple gastric polyps. Biopsied.                       -  Medium-sized hiatal hernia.                       - Normal antrum.                       - Normal examined duodenum. Recommendation:       - The findings and recommendations were discussed with                        the patient's family. Manya Silvas, MD 12/16/2016 9:21:43 AM This report has been signed electronically. Number of Addenda: 0 Note Initiated On: 12/16/2016 8:58 AM      [facility]

## 2016-12-16 NOTE — Transfer of Care (Signed)
Immediate Anesthesia Transfer of Care Note  Patient: Jane Moran  Procedure(s) Performed: Procedure(s): ESOPHAGOGASTRODUODENOSCOPY (EGD) WITH PROPOFOL (N/A)  Patient Location: PACU  Anesthesia Type:General  Level of Consciousness: awake, alert  and oriented  Airway & Oxygen Therapy: Patient Spontanous Breathing and Patient connected to nasal cannula oxygen  Post-op Assessment: Report given to RN and Post -op Vital signs reviewed and stable  Post vital signs: Reviewed and stable  Last Vitals:  Vitals:   12/16/16 0801 12/16/16 0917  BP: (!) 158/79 120/73  Pulse: 63 68  Resp: 18 15  Temp: (!) 36.2 C (!) 36.3 C  SpO2: 100% 98%    Last Pain:  Vitals:   12/16/16 0917  TempSrc: Tympanic         Complications: No apparent anesthesia complications

## 2016-12-16 NOTE — Anesthesia Post-op Follow-up Note (Signed)
Anesthesia QCDR form completed.        

## 2016-12-16 NOTE — Anesthesia Postprocedure Evaluation (Signed)
Anesthesia Post Note  Patient: [name]  Procedure(s) Performed: Procedure(s) (LRB): ESOPHAGOGASTRODUODENOSCOPY (EGD) WITH PROPOFOL (N/A)  Patient location during evaluation: Endoscopy Anesthesia Type: General Level of consciousness: awake and alert Pain management: pain level controlled Vital Signs Assessment: post-procedure vital signs reviewed and stable Respiratory status: spontaneous breathing, nonlabored ventilation, respiratory function stable and patient connected to nasal cannula oxygen Cardiovascular status: blood pressure returned to baseline and stable Postop Assessment: no signs of nausea or vomiting Anesthetic complications: no     Last Vitals:  Vitals:   12/16/16 0927 12/16/16 0957  BP: 116/70 134/76  Pulse: 62 62  Resp: 15 15  Temp:    SpO2: 99% 99%    Last Pain:  Vitals:   12/16/16 0917  TempSrc: Tympanic                 Precious Haws Denelle Capurro

## 2016-12-16 NOTE — H&P (Signed)
Primary Care Physician:  Adin Hector, MD Primary Gastroenterologist:  Dr. Vira Agar  Pre-Procedure History & Physical: HPI:  Jane Moran is a 66 y.o. female is here for an endoscopy.   Past Medical History:  Diagnosis Date  . Arthritis   . Cancer ([place])    skin  . Diffuse cystic mastopathy   . Dysrhythmia   . Esophageal reflux   . Family history of malignant neoplasm of breast   . H/O cystitis   . H/O endoscopy 2012  . Headache    migranes  . Hypertension 1990  . Pre-diabetes   . Screening cholesterol level   . Screening for obesity   . Special screening for malignant neoplasms, colon     Past Surgical History:  Procedure Laterality Date  . BREAST BIOPSY Left 2011   benign biopsy with Dr. Jamal Collin  . BREAST SURGERY     breast biopsy  . [place]  . COLONOSCOPY  2007  . COLONOSCOPY WITH PROPOFOL N/A 11/13/2014   Procedure: COLONOSCOPY WITH PROPOFOL;  Surgeon: Josefine Class, MD;  Location: [facility] ENDOSCOPY;  Service: Endoscopy;  Laterality: N/A;  . CYST REMOVAL NECK  [id number]  . KNEE SURGERY Left 2011    Prior to Admission medications   Medication Sig Start Date End Date Taking? Authorizing Provider  aspirin-acetaminophen-caffeine (EXCEDRIN MIGRAINE) [id number] MG per tablet Take 1 tablet by mouth every 6 (six) hours as needed for pain.   Yes [provider]  Cholecalciferol (VITAMIN D) 2000 UNITS CAPS Take 1 capsule by mouth daily. 400mg    Yes [provider]  dexlansoprazole (DEXILANT) 60 MG capsule Take 60 mg by mouth daily.   Yes [provider]  metFORMIN (GLUCOPHAGE) 500 MG tablet Take by mouth daily with breakfast.   Yes [provider]  ranitidine (ZANTAC) 300 MG tablet Take 300 mg by mouth at bedtime.   Yes [provider]  lisinopril-hydrochlorothiazide (PRINZIDE,ZESTORETIC) 20-25 MG per tablet Take 1 tablet by mouth daily.    [provider]  triamcinolone cream (KENALOG) 0.5 % Apply 1  application topically 3 (three) times daily.    [provider]    Allergies as of 11/18/2016 - Review Complete 11/13/2014  Allergen Reaction Noted  . Codeine Nausea Only 06/08/2012  . Zinc oxide Rash 06/08/2012    Family History  Problem Relation Age of Onset  . Breast cancer Mother 61  . Lung cancer Father   . Prostate cancer Maternal Grandfather   . Breast cancer Paternal Aunt   . Breast cancer Cousin     Social History   Social History  . Marital status: Married    Spouse name: N/A  . Number of children: N/A  . Years of education: N/A   Occupational History  . Not on file.   Social History Main Topics  . Smoking status: Never Smoker  . Smokeless tobacco: Never Used  . Alcohol use No  . Drug use: No  . Sexual activity: Not on file   Other Topics Concern  . Not on file   Social History Narrative  . No narrative on file    Review of Systems: See HPI, otherwise negative ROS  Physical Exam: BP (!) 158/79   Pulse 63   Temp (!) 97.2 F (36.2 C) (Tympanic)   Resp 18   Ht 5\' 2"  (1.575 m)   Wt 111.1 kg (245 lb)   SpO2 100%   BMI 44.81 kg/m  General:   Alert,  pleasant and cooperative in NAD Head:  Normocephalic and atraumatic. Neck:  Supple; no masses or thyromegaly. Lungs:  Clear throughout to auscultation.    Heart:  Regular rate and rhythm. Abdomen:  Soft, nontender and nondistended. Normal bowel sounds, without guarding, and without rebound.   Neurologic:  Alert and  oriented x4;  grossly normal neurologically.  Impression/Plan: Jane Moran is here for an endoscopy to be performed for dysphagia.  Risks, benefits, limitations, and alternatives regarding  endoscopy have been reviewed with the patient.  Questions have been answered.  All parties agreeable.   Gaylyn Cheers, MD  12/16/2016, 8:58 AM

## 2016-12-20 ENCOUNTER — Encounter: Payer: Self-pay | Admitting: Unknown Physician Specialty

## 2016-12-20 ENCOUNTER — Other Ambulatory Visit: Payer: Self-pay | Admitting: Unknown Physician Specialty

## 2016-12-20 DIAGNOSIS — Z0001 Encounter for general adult medical examination with abnormal findings: Secondary | ICD-10-CM

## 2016-12-20 DIAGNOSIS — R1900 Intra-abdominal and pelvic swelling, mass and lump, unspecified site: Secondary | ICD-10-CM

## 2016-12-20 LAB — SURGICAL PATHOLOGY

## 2017-11-07 ENCOUNTER — Other Ambulatory Visit: Payer: Self-pay | Admitting: Internal Medicine

## 2017-11-07 DIAGNOSIS — Z1231 Encounter for screening mammogram for malignant neoplasm of breast: Secondary | ICD-10-CM

## 2017-12-26 ENCOUNTER — Ambulatory Visit
Admission: RE | Admit: 2017-12-26 | Discharge: 2017-12-26 | Disposition: A | Discharge status: Home / Self Care | Payer: BLUE CROSS/BLUE SHIELD | Source: Ambulatory Visit | Attending: Internal Medicine | Admitting: Internal Medicine

## 2017-12-26 DIAGNOSIS — Z1231 Encounter for screening mammogram for malignant neoplasm of breast: Secondary | ICD-10-CM | POA: Diagnosis not present

## 2018-03-05 ENCOUNTER — Ambulatory Visit: Payer: BLUE CROSS/BLUE SHIELD | Admitting: Anesthesiology

## 2018-03-05 ENCOUNTER — Encounter
Admission: RE | Disposition: A | Discharge status: Home / Self Care | Payer: Self-pay | Source: Ambulatory Visit | Attending: Unknown Physician Specialty

## 2018-03-05 ENCOUNTER — Ambulatory Visit
Admission: RE | Admit: 2018-03-05 | Discharge: 2018-03-05 | Disposition: A | Discharge status: Home / Self Care | Payer: BLUE CROSS/BLUE SHIELD | Source: Ambulatory Visit | Attending: Unknown Physician Specialty | Admitting: Unknown Physician Specialty

## 2018-03-05 DIAGNOSIS — K219 Gastro-esophageal reflux disease without esophagitis: Secondary | ICD-10-CM | POA: Insufficient documentation

## 2018-03-05 DIAGNOSIS — K640 First degree hemorrhoids: Secondary | ICD-10-CM | POA: Diagnosis not present

## 2018-03-05 DIAGNOSIS — I10 Essential (primary) hypertension: Secondary | ICD-10-CM | POA: Diagnosis not present

## 2018-03-05 DIAGNOSIS — Z86010 Personal history of colonic polyps: Secondary | ICD-10-CM | POA: Insufficient documentation

## 2018-03-05 DIAGNOSIS — Z79899 Other long term (current) drug therapy: Secondary | ICD-10-CM | POA: Insufficient documentation

## 2018-03-05 DIAGNOSIS — Z7984 Long term (current) use of oral hypoglycemic drugs: Secondary | ICD-10-CM | POA: Diagnosis not present

## 2018-03-05 DIAGNOSIS — G43909 Migraine, unspecified, not intractable, without status migrainosus: Secondary | ICD-10-CM | POA: Insufficient documentation

## 2018-03-05 DIAGNOSIS — R7303 Prediabetes: Secondary | ICD-10-CM | POA: Diagnosis not present

## 2018-03-05 DIAGNOSIS — D122 Benign neoplasm of ascending colon: Secondary | ICD-10-CM | POA: Diagnosis not present

## 2018-03-05 DIAGNOSIS — Z85828 Personal history of other malignant neoplasm of skin: Secondary | ICD-10-CM | POA: Diagnosis not present

## 2018-03-05 DIAGNOSIS — Z09 Encounter for follow-up examination after completed treatment for conditions other than malignant neoplasm: Secondary | ICD-10-CM | POA: Diagnosis not present

## 2018-03-05 HISTORY — PX: COLONOSCOPY WITH PROPOFOL: SHX5780

## 2018-03-05 LAB — GLUCOSE, CAPILLARY: Glucose-Capillary: 105 mg/dL — ABNORMAL HIGH (ref 70–99)

## 2018-03-05 SURGERY — COLONOSCOPY WITH PROPOFOL
Anesthesia: General

## 2018-03-05 MED ORDER — MIDAZOLAM HCL 2 MG/2ML IJ SOLN
INTRAMUSCULAR | Status: AC
  Filled 2018-03-05: qty 2

## 2018-03-05 MED ORDER — PROPOFOL 10 MG/ML IV BOLUS
INTRAVENOUS | Status: DC | PRN
  Administered 2018-03-05: 30 mg via INTRAVENOUS
  Administered 2018-03-05 (×2): 20 mg via INTRAVENOUS

## 2018-03-05 MED ORDER — LIDOCAINE HCL (PF) 1 % IJ SOLN
INTRAMUSCULAR | Status: AC
  Administered 2018-03-05: 0.3 mL via INTRADERMAL
  Filled 2018-03-05: qty 2

## 2018-03-05 MED ORDER — SODIUM CHLORIDE 0.9 % IV SOLN
INTRAVENOUS | Status: DC
  Administered 2018-03-05: 1000 mL via INTRAVENOUS

## 2018-03-05 MED ORDER — LIDOCAINE HCL (PF) 2 % IJ SOLN
INTRAMUSCULAR | Status: AC
  Filled 2018-03-05: qty 10

## 2018-03-05 MED ORDER — FENTANYL CITRATE (PF) 100 MCG/2ML IJ SOLN
INTRAMUSCULAR | Status: AC
  Filled 2018-03-05: qty 2

## 2018-03-05 MED ORDER — MIDAZOLAM HCL 5 MG/5ML IJ SOLN
INTRAMUSCULAR | Status: DC | PRN
  Administered 2018-03-05: 2 mg via INTRAVENOUS

## 2018-03-05 MED ORDER — PROPOFOL 500 MG/50ML IV EMUL
INTRAVENOUS | Status: AC
  Filled 2018-03-05: qty 50

## 2018-03-05 MED ORDER — LIDOCAINE HCL (PF) 1 % IJ SOLN
2.0000 mL | Freq: Once | INTRAMUSCULAR | Status: AC
  Administered 2018-03-05: 0.3 mL via INTRADERMAL

## 2018-03-05 MED ORDER — LIDOCAINE HCL (PF) 2 % IJ SOLN
INTRAMUSCULAR | Status: DC | PRN
  Administered 2018-03-05: 100 mg

## 2018-03-05 MED ORDER — SODIUM CHLORIDE 0.9 % IV SOLN: INTRAVENOUS | Status: DC

## 2018-03-05 MED ORDER — FENTANYL CITRATE (PF) 100 MCG/2ML IJ SOLN
INTRAMUSCULAR | Status: DC | PRN
  Administered 2018-03-05 (×2): 50 ug via INTRAVENOUS

## 2018-03-05 MED ORDER — PROPOFOL 500 MG/50ML IV EMUL
INTRAVENOUS | Status: DC | PRN
  Administered 2018-03-05: 50 ug/kg/min via INTRAVENOUS

## 2018-03-05 NOTE — OR Nursing (Signed)
Patient has a long torturous colon, able to visualize the cecum past ilial cecal valve.

## 2018-03-05 NOTE — Anesthesia Postprocedure Evaluation (Signed)
Anesthesia Post Note  Patient: Jane Moran  Procedure(s) Performed: COLONOSCOPY WITH PROPOFOL (N/A )  Patient location during evaluation: Endoscopy Anesthesia Type: General Level of consciousness: awake and alert and oriented Pain management: pain level controlled Vital Signs Assessment: post-procedure vital signs reviewed and stable Respiratory status: spontaneous breathing, nonlabored ventilation and respiratory function stable Cardiovascular status: blood pressure returned to baseline and stable Postop Assessment: no signs of nausea or vomiting Anesthetic complications: no     Last Vitals:  Vitals:   03/05/18 0840 03/05/18 0850  BP: 121/75 124/64  Pulse: 64 67  Resp: 16 18  Temp:    SpO2: 100% 99%    Last Pain:  Vitals:   03/05/18 0820  TempSrc: Tympanic  PainSc:                  Jane Moran

## 2018-03-05 NOTE — Anesthesia Post-op Follow-up Note (Signed)
Anesthesia QCDR form completed.        

## 2018-03-05 NOTE — Anesthesia Preprocedure Evaluation (Signed)
Anesthesia Evaluation  Patient identified by MRN, date of birth, ID band Patient awake    Reviewed: Allergy & Precautions, NPO status , Patient's Chart, lab work & pertinent test results  History of Anesthesia Complications Negative for: history of anesthetic complications  Airway Mallampati: III  TM Distance: >3 FB Neck ROM: Full    Dental no notable dental hx.    Pulmonary neg pulmonary ROS, neg sleep apnea, neg COPD,    breath sounds clear to auscultation- rhonchi (-) wheezing      Cardiovascular hypertension, Pt. on medications (-) CAD, (-) Past MI, (-) Cardiac Stents and (-) CABG  Rhythm:Regular Rate:Normal - Systolic murmurs and - Diastolic murmurs    Neuro/Psych  Headaches, negative psych ROS   GI/Hepatic Neg liver ROS, GERD  ,  Endo/Other  negative endocrine ROSneg diabetes  Renal/GU negative Renal ROS     Musculoskeletal  (+) Arthritis ,   Abdominal (+) + obese,   Peds  Hematology negative hematology ROS (+)   Anesthesia Other Findings Past Medical History: No date: Arthritis No date: Cancer ([place])     Comment:  skin No date: Diffuse cystic mastopathy No date: Dysrhythmia No date: Esophageal reflux No date: Family history of malignant neoplasm of breast No date: H/O cystitis 2012: H/O endoscopy No date: Headache     Comment:  migranes 1990: Hypertension No date: Pre-diabetes No date: Screening cholesterol level No date: Screening for obesity No date: Special screening for malignant neoplasms, colon   Reproductive/Obstetrics                             Anesthesia Physical Anesthesia Plan  ASA: II  Anesthesia Plan: General   Post-op Pain Management:    Induction: Intravenous  PONV Risk Score and Plan: 2 and Propofol infusion  Airway Management Planned: Natural Airway  Additional Equipment:   Intra-op Plan:   Post-operative Plan:   Informed Consent: I have  reviewed the patients History and Physical, chart, labs and discussed the procedure including the risks, benefits and alternatives for the proposed anesthesia with the patient or authorized representative who has indicated his/her understanding and acceptance.   Dental advisory given  Plan Discussed with: CRNA and Anesthesiologist  Anesthesia Plan Comments:         Anesthesia Quick Evaluation

## 2018-03-05 NOTE — Op Note (Signed)
[facility] Gastroenterology Patient Name: Jane Moran Procedure Date: 03/05/2018 7:57 AM MRN: [id number] Account #: [id number] Date of Birth: May 04, 1950 Admit Type: Outpatient Age: 67 Room: [facility] ENDO ROOM 1 Gender: Female Note Status: Finalized Procedure:            Colonoscopy Indications:          High risk colon cancer surveillance: Personal history                        of colonic polyps Providers:            Manya Silvas, MD Referring MD:         Ramonita Lab, MD (Referring MD) Medicines:            Propofol per Anesthesia Complications:        No immediate complications. Procedure:            Pre-Anesthesia Assessment:                       - After reviewing the risks and benefits, the patient                        was deemed in satisfactory condition to undergo the                        procedure.                       After obtaining informed consent, the colonoscope was                        passed under direct vision. Throughout the procedure,                        the patient's blood pressure, pulse, and oxygen                        saturations were monitored continuously. The                        Colonoscope was introduced through the anus and                        advanced to the the cecum, identified by its                        appearance. The colonoscopy was somewhat difficult due                        to a redundant colon. Successful completion of the                        procedure was aided by applying abdominal pressure. The                        patient tolerated the procedure well. The quality of                        the bowel preparation was excellent. Findings:      A diminutive polyp was found in the proximal ascending colon. The polyp  was sessile. The polyp was removed with a jumbo cold forceps. Resection       and retrieval were complete.      Internal hemorrhoids were found during endoscopy. The hemorrhoids  were       small and Grade I (internal hemorrhoids that do not prolapse).      The cecum could be seen and most of it but a small area of liquid kept       me from complete visualization. No amount of pushing the scope could       cause me to reach the cecum.      The exam was otherwise without abnormality. Impression:           - One diminutive polyp in the proximal ascending colon,                        removed with a jumbo cold forceps. Resected and                        retrieved.                       - Internal hemorrhoids.                       - The examination was otherwise normal. Recommendation:       - Await pathology results. Manya Silvas, MD 03/05/2018 8:29:26 AM This report has been signed electronically. Number of Addenda: 0 Note Initiated On: 03/05/2018 7:57 AM Scope Withdrawal Time: 0 hours 6 minutes 19 seconds  Total Procedure Duration: 0 hours 21 minutes 56 seconds       [facility]

## 2018-03-05 NOTE — Transfer of Care (Signed)
Immediate Anesthesia Transfer of Care Note  Patient: Jane Moran  Procedure(s) Performed: COLONOSCOPY WITH PROPOFOL (N/A )  Patient Location: PACU  Anesthesia Type:General  Level of Consciousness: sedated  Airway & Oxygen Therapy: Patient Spontanous Breathing and Patient connected to nasal cannula oxygen  Post-op Assessment: Report given to RN and Post -op Vital signs reviewed and stable  Post vital signs: Reviewed and stable  Last Vitals:  Vitals Value Taken Time  BP 107/65 03/05/2018  8:29 AM  Temp 35.9 C 03/05/2018  8:20 AM  Pulse 65 03/05/2018  8:29 AM  Resp 20 03/05/2018  8:29 AM  SpO2 98 % 03/05/2018  8:29 AM  Vitals shown include unvalidated device data.  Last Pain:  Vitals:   03/05/18 0820  TempSrc: Tympanic  PainSc:          Complications: No apparent anesthesia complications

## 2018-03-05 NOTE — H&P (Signed)
Primary Care Physician:  Adin Hector, MD Primary Gastroenterologist:  Dr. Vira Agar  Pre-Procedure History & Physical: HPI:  Jane Moran is a 67 y.o. female is here for an colonoscopy.   Past Medical History:  Diagnosis Date  . Arthritis   . Cancer ([place])    skin  . Diffuse cystic mastopathy   . Dysrhythmia   . Esophageal reflux   . Family history of malignant neoplasm of breast   . H/O cystitis   . H/O endoscopy 2012  . Headache    migranes  . Hypertension 1990  . Pre-diabetes   . Screening cholesterol level   . Screening for obesity   . Special screening for malignant neoplasms, colon     Past Surgical History:  Procedure Laterality Date  . BREAST BIOPSY Left 2011   benign biopsy with Dr. Jamal Collin  . BREAST SURGERY     breast biopsy  . [place]  . COLONOSCOPY  2007  . COLONOSCOPY WITH PROPOFOL N/A 11/13/2014   Procedure: COLONOSCOPY WITH PROPOFOL;  Surgeon: Josefine Class, MD;  Location: [facility] ENDOSCOPY;  Service: Endoscopy;  Laterality: N/A;  . CYST REMOVAL NECK  [id number]  . ESOPHAGOGASTRODUODENOSCOPY (EGD) WITH PROPOFOL N/A 12/16/2016   Procedure: ESOPHAGOGASTRODUODENOSCOPY (EGD) WITH PROPOFOL;  Surgeon: Manya Silvas, MD;  Location: [facility] ENDOSCOPY;  Service: Endoscopy;  Laterality: N/A;  . KNEE SURGERY Left 2011    Prior to Admission medications   Medication Sig Start Date End Date Taking? Authorizing Provider  aspirin-acetaminophen-caffeine (EXCEDRIN MIGRAINE) [phone] MG per tablet Take 1 tablet by mouth every 6 (six) hours as needed for pain.    [provider]  Cholecalciferol (VITAMIN D) 2000 UNITS CAPS Take 1 capsule by mouth daily. 400mg     [provider]  dexlansoprazole (DEXILANT) 60 MG capsule Take 60 mg by mouth daily.    [provider]  lisinopril-hydrochlorothiazide (PRINZIDE,ZESTORETIC) 20-25 MG per tablet Take 1 tablet by mouth daily.    [provider]  metFORMIN (GLUCOPHAGE) 500 MG  tablet Take by mouth daily with breakfast.    [provider]  ranitidine (ZANTAC) 300 MG tablet Take 300 mg by mouth at bedtime.    [provider]  triamcinolone cream (KENALOG) 0.5 % Apply 1 application topically 3 (three) times daily.    [provider]    Allergies as of 02/05/2018 - Review Complete 12/16/2016  Allergen Reaction Noted  . Codeine Nausea Only 06/08/2012  . Zinc oxide Rash 06/08/2012    Family History  Problem Relation Age of Onset  . Breast cancer Mother 28  . Lung cancer Father   . Prostate cancer Maternal Grandfather   . Breast cancer Paternal Aunt   . Breast cancer Cousin        paternal side    Social History   Socioeconomic History  . Marital status: Married    Spouse name: Not on file  . Number of children: Not on file  . Years of education: Not on file  . Highest education level: Not on file  Occupational History  . Not on file  Social Needs  . Financial resource strain: Not on file  . Food insecurity:    Worry: Not on file    Inability: Not on file  . Transportation needs:    Medical: Not on file    Non-medical: Not on file  Tobacco Use  . Smoking status: Never Smoker  . Smokeless tobacco: Never Used  Substance and Sexual  Activity  . Alcohol use: No  . Drug use: No  . Sexual activity: Not on file  Lifestyle  . Physical activity:    Days per week: Not on file    Minutes per session: Not on file  . Stress: Not on file  Relationships  . Social connections:    Talks on phone: Not on file    Gets together: Not on file    Attends religious service: Not on file    Active member of club or organization: Not on file    Attends meetings of clubs or organizations: Not on file    Relationship status: Not on file  . Intimate partner violence:    Fear of current or ex partner: Not on file    Emotionally abused: Not on file    Physically abused: Not on file    Forced sexual activity: Not on file  Other Topics  Concern  . Not on file  Social History Narrative  . Not on file    Review of Systems: See HPI, otherwise negative ROS  Physical Exam: BP (!) 157/92   Pulse 61   Temp 98.3 F (36.8 C) (Tympanic)   Resp 17   Ht 5\' 2"  (1.575 m)   Wt 111.1 kg   SpO2 99%   BMI 44.80 kg/m  General:   Alert,  pleasant and cooperative in NAD Head:  Normocephalic and atraumatic. Neck:  Supple; no masses or thyromegaly. Lungs:  Clear throughout to auscultation.    Heart:  Regular rate and rhythm. Abdomen:  Soft, nontender and nondistended. Normal bowel sounds, without guarding, and without rebound.   Neurologic:  Alert and  oriented x4;  grossly normal neurologically.  Impression/Plan: Jane Moran is here for an colonoscopy to be performed for [facility] colon polyps.  11/13/2014 was last colon.   Risks, benefits, limitations, and alternatives regarding  colonoscopy have been reviewed with the patient.  Questions have been answered.  All parties agreeable.   Gaylyn Cheers, MD  03/05/2018, 7:54 AM

## 2018-03-06 LAB — SURGICAL PATHOLOGY

## 2018-03-09 ENCOUNTER — Encounter: Payer: Self-pay | Admitting: Unknown Physician Specialty

## 2018-07-01 ENCOUNTER — Observation Stay: Payer: BLUE CROSS/BLUE SHIELD

## 2018-07-01 ENCOUNTER — Other Ambulatory Visit: Payer: Self-pay

## 2018-07-01 ENCOUNTER — Encounter: Payer: Self-pay | Admitting: Emergency Medicine

## 2018-07-01 ENCOUNTER — Emergency Department: Payer: BLUE CROSS/BLUE SHIELD

## 2018-07-01 ENCOUNTER — Inpatient Hospital Stay
Admission: EM | Admit: 2018-07-01 | Discharge: 2018-07-03 | DRG: 065 | Disposition: A | Discharge status: Home / Self Care | Payer: BLUE CROSS/BLUE SHIELD | Attending: Internal Medicine | Admitting: Internal Medicine

## 2018-07-01 DIAGNOSIS — R200 Anesthesia of skin: Secondary | ICD-10-CM | POA: Diagnosis not present

## 2018-07-01 DIAGNOSIS — Z6841 Body Mass Index (BMI) 40.0 and over, adult: Secondary | ICD-10-CM

## 2018-07-01 DIAGNOSIS — E785 Hyperlipidemia, unspecified: Secondary | ICD-10-CM | POA: Diagnosis present

## 2018-07-01 DIAGNOSIS — M1990 Unspecified osteoarthritis, unspecified site: Secondary | ICD-10-CM | POA: Diagnosis present

## 2018-07-01 DIAGNOSIS — E119 Type 2 diabetes mellitus without complications: Secondary | ICD-10-CM | POA: Diagnosis present

## 2018-07-01 DIAGNOSIS — I639 Cerebral infarction, unspecified: Secondary | ICD-10-CM | POA: Diagnosis present

## 2018-07-01 DIAGNOSIS — I10 Essential (primary) hypertension: Secondary | ICD-10-CM | POA: Diagnosis present

## 2018-07-01 DIAGNOSIS — G459 Transient cerebral ischemic attack, unspecified: Secondary | ICD-10-CM | POA: Diagnosis present

## 2018-07-01 DIAGNOSIS — I480 Paroxysmal atrial fibrillation: Secondary | ICD-10-CM | POA: Diagnosis present

## 2018-07-01 DIAGNOSIS — M792 Neuralgia and neuritis, unspecified: Secondary | ICD-10-CM | POA: Diagnosis present

## 2018-07-01 DIAGNOSIS — Z7984 Long term (current) use of oral hypoglycemic drugs: Secondary | ICD-10-CM

## 2018-07-01 DIAGNOSIS — Z888 Allergy status to other drugs, medicaments and biological substances status: Secondary | ICD-10-CM

## 2018-07-01 DIAGNOSIS — R29701 NIHSS score 1: Secondary | ICD-10-CM | POA: Diagnosis not present

## 2018-07-01 DIAGNOSIS — G43909 Migraine, unspecified, not intractable, without status migrainosus: Secondary | ICD-10-CM | POA: Diagnosis present

## 2018-07-01 DIAGNOSIS — I63519 Cerebral infarction due to unspecified occlusion or stenosis of unspecified middle cerebral artery: Secondary | ICD-10-CM | POA: Diagnosis not present

## 2018-07-01 DIAGNOSIS — Z79899 Other long term (current) drug therapy: Secondary | ICD-10-CM

## 2018-07-01 DIAGNOSIS — Z85828 Personal history of other malignant neoplasm of skin: Secondary | ICD-10-CM

## 2018-07-01 DIAGNOSIS — R29700 NIHSS score 0: Secondary | ICD-10-CM | POA: Diagnosis present

## 2018-07-01 DIAGNOSIS — E669 Obesity, unspecified: Secondary | ICD-10-CM | POA: Diagnosis present

## 2018-07-01 DIAGNOSIS — K219 Gastro-esophageal reflux disease without esophagitis: Secondary | ICD-10-CM | POA: Diagnosis present

## 2018-07-01 DIAGNOSIS — Z23 Encounter for immunization: Secondary | ICD-10-CM

## 2018-07-01 DIAGNOSIS — Z885 Allergy status to narcotic agent status: Secondary | ICD-10-CM

## 2018-07-01 DIAGNOSIS — G4733 Obstructive sleep apnea (adult) (pediatric): Secondary | ICD-10-CM | POA: Diagnosis present

## 2018-07-01 LAB — GLUCOSE, CAPILLARY
Glucose-Capillary: 102 mg/dL — ABNORMAL HIGH (ref 70–99)
Glucose-Capillary: 83 mg/dL (ref 70–99)

## 2018-07-01 LAB — TROPONIN I

## 2018-07-01 LAB — COMPREHENSIVE METABOLIC PANEL WITH GFR
ALT: 27 U/L (ref 0–44)
AST: 23 U/L (ref 15–41)
Albumin: 3.7 g/dL (ref 3.5–5.0)
Alkaline Phosphatase: 51 U/L (ref 38–126)
Anion gap: 7 (ref 5–15)
BUN: 17 mg/dL (ref 8–23)
CO2: 28 mmol/L (ref 22–32)
Calcium: 8.8 mg/dL — ABNORMAL LOW (ref 8.9–10.3)
Chloride: 104 mmol/L (ref 98–111)
Creatinine, Ser: 0.57 mg/dL (ref 0.44–1.00)
GFR calc Af Amer: >60 mL/min
GFR calc non Af Amer: >60 mL/min
Glucose, Bld: 104 mg/dL — ABNORMAL HIGH (ref 70–99)
Potassium: 3.8 mmol/L (ref 3.5–5.1)
Sodium: 139 mmol/L (ref 135–145)
Total Bilirubin: 0.5 mg/dL (ref 0.3–1.2)
Total Protein: 6.7 g/dL (ref 6.5–8.1)

## 2018-07-01 LAB — DIFFERENTIAL
Abs Immature Granulocytes: 0.03 K/uL (ref 0.00–0.07)
Basophils Absolute: 0.1 K/uL (ref 0.0–0.1)
Basophils Relative: 1 %
Eosinophils Absolute: 0.2 K/uL (ref 0.0–0.5)
Eosinophils Relative: 2 %
Immature Granulocytes: 0 %
Lymphocytes Relative: 22 %
Lymphs Abs: 2.2 K/uL (ref 0.7–4.0)
Monocytes Absolute: 0.8 K/uL (ref 0.1–1.0)
Monocytes Relative: 8 %
Neutro Abs: 6.6 K/uL (ref 1.7–7.7)
Neutrophils Relative %: 67 %

## 2018-07-01 LAB — CBC
HCT: 37.9 % (ref 36.0–46.0)
Hemoglobin: 11.9 g/dL — ABNORMAL LOW (ref 12.0–15.0)
MCH: 27.2 pg (ref 26.0–34.0)
MCHC: 31.4 g/dL (ref 30.0–36.0)
MCV: 86.7 fL (ref 80.0–100.0)
Platelets: 351 K/uL (ref 150–400)
RBC: 4.37 MIL/uL (ref 3.87–5.11)
RDW: 14.7 % (ref 11.5–15.5)
WBC: 9.9 K/uL (ref 4.0–10.5)
nRBC: 0 % (ref 0.0–0.2)

## 2018-07-01 LAB — PROTIME-INR
INR: 0.9 (ref 0.8–1.2)
Prothrombin Time: 12.1 s (ref 11.4–15.2)

## 2018-07-01 LAB — APTT: APTT: 26 s (ref 24–36)

## 2018-07-01 MED ORDER — METOPROLOL TARTRATE 25 MG PO TABS
12.5000 mg | ORAL_TABLET | Freq: Two times a day (BID) | ORAL | Status: DC
  Administered 2018-07-01 – 2018-07-02 (×2): 12.5 mg via ORAL
  Filled 2018-07-01 (×2): qty 1

## 2018-07-01 MED ORDER — VITAMIN D 25 MCG (1000 UNIT) PO TABS
2000.0000 [IU] | ORAL_TABLET | Freq: Every day | ORAL | Status: DC
  Administered 2018-07-01 – 2018-07-03 (×3): 2000 [IU] via ORAL
  Filled 2018-07-01 (×3): qty 2

## 2018-07-01 MED ORDER — HYDROCHLOROTHIAZIDE 12.5 MG PO CAPS
12.5000 mg | ORAL_CAPSULE | Freq: Every day | ORAL | Status: DC
  Administered 2018-07-02 – 2018-07-03 (×2): 12.5 mg via ORAL
  Filled 2018-07-01 (×2): qty 1

## 2018-07-01 MED ORDER — INSULIN ASPART 100 UNIT/ML ~~LOC~~ SOLN: 0.0000 [IU] | Freq: Three times a day (TID) | SUBCUTANEOUS | Status: DC

## 2018-07-01 MED ORDER — PNEUMOCOCCAL VAC POLYVALENT 25 MCG/0.5ML IJ INJ
0.5000 mL | INJECTION | INTRAMUSCULAR | Status: AC
  Administered 2018-07-02: 0.5 mL via INTRAMUSCULAR
  Filled 2018-07-01: qty 0.5

## 2018-07-01 MED ORDER — ACETAMINOPHEN 325 MG PO TABS
650.0000 mg | ORAL_TABLET | ORAL | Status: DC | PRN
  Administered 2018-07-02: 21:00:00 650 mg via ORAL
  Filled 2018-07-01: qty 2

## 2018-07-01 MED ORDER — HYDROCHLOROTHIAZIDE 25 MG PO TABS: 25.0000 mg | ORAL_TABLET | Freq: Every day | ORAL | Status: DC

## 2018-07-01 MED ORDER — ACETAMINOPHEN 160 MG/5ML PO SOLN
650.0000 mg | ORAL | Status: DC | PRN
  Filled 2018-07-01: qty 20.3

## 2018-07-01 MED ORDER — LOSARTAN POTASSIUM 50 MG PO TABS
50.0000 mg | ORAL_TABLET | Freq: Every day | ORAL | Status: DC
  Administered 2018-07-02 – 2018-07-03 (×2): 50 mg via ORAL
  Filled 2018-07-01 (×2): qty 1

## 2018-07-01 MED ORDER — ENOXAPARIN SODIUM 40 MG/0.4ML ~~LOC~~ SOLN
40.0000 mg | Freq: Two times a day (BID) | SUBCUTANEOUS | Status: DC
  Administered 2018-07-01 – 2018-07-02 (×3): 40 mg via SUBCUTANEOUS
  Filled 2018-07-01 (×4): qty 0.4

## 2018-07-01 MED ORDER — STROKE: EARLY STAGES OF RECOVERY BOOK
Freq: Once | Status: AC
  Administered 2018-07-01: 17:00:00

## 2018-07-01 MED ORDER — LISINOPRIL-HYDROCHLOROTHIAZIDE 20-25 MG PO TABS: 1.0000 | ORAL_TABLET | Freq: Every day | ORAL | Status: DC

## 2018-07-01 MED ORDER — SODIUM CHLORIDE 0.9% FLUSH: 3.0000 mL | Freq: Once | INTRAVENOUS | Status: DC

## 2018-07-01 MED ORDER — ASPIRIN 300 MG RE SUPP: 300.0000 mg | Freq: Every day | RECTAL | Status: DC

## 2018-07-01 MED ORDER — METFORMIN HCL 500 MG PO TABS
500.0000 mg | ORAL_TABLET | Freq: Every day | ORAL | Status: DC
  Administered 2018-07-02: 09:00:00 500 mg via ORAL
  Filled 2018-07-01: qty 1

## 2018-07-01 MED ORDER — ACETAMINOPHEN 650 MG RE SUPP: 650.0000 mg | RECTAL | Status: DC | PRN

## 2018-07-01 MED ORDER — PANTOPRAZOLE SODIUM 40 MG PO TBEC
40.0000 mg | DELAYED_RELEASE_TABLET | Freq: Every day | ORAL | Status: DC
  Administered 2018-07-02 – 2018-07-03 (×2): 40 mg via ORAL
  Filled 2018-07-01 (×3): qty 1

## 2018-07-01 MED ORDER — LISINOPRIL 20 MG PO TABS: 20.0000 mg | ORAL_TABLET | Freq: Every day | ORAL | Status: DC

## 2018-07-01 MED ORDER — ASPIRIN 325 MG PO TABS
325.0000 mg | ORAL_TABLET | Freq: Every day | ORAL | Status: DC
  Administered 2018-07-02: 325 mg via ORAL
  Filled 2018-07-01 (×2): qty 1

## 2018-07-01 NOTE — H&P (Addendum)
[facility] [place] NAME: Jane Moran    MR#:  [id number]  DATE OF BIRTH:  10-30-1950  DATE OF ADMISSION:  07/01/2018  PRIMARY CARE PHYSICIAN: Adin Hector, MD   REQUESTING/REFERRING PHYSICIAN: Dr. Harvest Dark.  CHIEF COMPLAINT: Right-sided numbness   Chief Complaint  Patient presents with  . Numbness    HISTORY OF PRESENT ILLNESS:  Jane Moran  is a 67 y.o. female with a known history of neuritis, hypertension, GERD migraines comes in because of right-sided facial numbness, transient confusion.According to the patient around 9:00 this morning she was getting ready when she acutely became confused, could not figure out how to get her bra on.  Patient states she then tried to text a family member but could not figure out how to use her phone.  Patient states she has never had any issues with getting dressed or using her phone in the past.  States approximate hour after that the patient began having right-sided facial and hand numbness.  Did develop a moderate headache as well but has a history of migraines.  No history of complex/complicated migraines in the past.  By time patient arrived to the emergency department she states her symptoms have resolved.  Patient is feeling normal at this time.  Initial NIH stroke scale when she came is 0.  PAST MEDICAL HISTORY:   Past Medical History:  Diagnosis Date  . Arthritis   . Cancer ([place])    skin  . Diffuse cystic mastopathy   . Dysrhythmia   . Esophageal reflux   . Family history of malignant neoplasm of breast   . H/O cystitis   . H/O endoscopy 2012  . Headache    migranes  . Hypertension 1990  . Pre-diabetes   . Screening cholesterol level   . Screening for obesity   . Special screening for malignant neoplasms, colon     PAST SURGICAL HISTOIRY:   Past Surgical History:  Procedure Laterality Date  . BREAST BIOPSY Left 2011   benign biopsy with Dr. Jamal Collin  .  BREAST SURGERY     breast biopsy  . [place]  . COLONOSCOPY  2007  . COLONOSCOPY WITH PROPOFOL N/A 11/13/2014   Procedure: COLONOSCOPY WITH PROPOFOL;  Surgeon: Josefine Class, MD;  Location: [facility] ENDOSCOPY;  Service: Endoscopy;  Laterality: N/A;  . COLONOSCOPY WITH PROPOFOL N/A 03/05/2018   Procedure: COLONOSCOPY WITH PROPOFOL;  Surgeon: Manya Silvas, MD;  Location: [facility] ENDOSCOPY;  Service: Endoscopy;  Laterality: N/A;  . CYST REMOVAL NECK  [id number]  . ESOPHAGOGASTRODUODENOSCOPY (EGD) WITH PROPOFOL N/A 12/16/2016   Procedure: ESOPHAGOGASTRODUODENOSCOPY (EGD) WITH PROPOFOL;  Surgeon: Manya Silvas, MD;  Location: [facility] ENDOSCOPY;  Service: Endoscopy;  Laterality: N/A;  . KNEE SURGERY Left 2011    SOCIAL HISTORY:   Social History   Tobacco Use  . Smoking status: Never Smoker  . Smokeless tobacco: Never Used  Substance Use Topics  . Alcohol use: No    FAMILY HISTORY:   Family History  Problem Relation Age of Onset  . Breast cancer Mother 7  . Lung cancer Father   . Prostate cancer Maternal Grandfather   . Breast cancer Paternal Aunt   . Breast cancer Cousin        paternal side    DRUG ALLERGIES:   Allergies  Allergen Reactions  . Codeine Nausea Only  . Zinc Oxide Rash    REVIEW OF SYSTEMS:  CONSTITUTIONAL:  No fever, fatigue or weakness.  EYES: No blurred or double vision.  EARS, NOSE, AND THROAT: No tinnitus or ear pain.  RESPIRATORY: No cough, shortness of breath, wheezing or hemoptysis.  CARDIOVASCULAR: No chest pain, orthopnea, edema.  GASTROINTESTINAL: No nausea, vomiting, diarrhea or abdominal pain.  GENITOURINARY: No dysuria, hematuria.  ENDOCRINE: No polyuria, nocturia,  HEMATOLOGY: No anemia, easy bruising or bleeding SKIN: No rash or lesion. MUSCULOSKELETAL: No joint pain or arthritis.   NEUROLOGIC: Right facial numbness, transient confusion.Marland Kitchen  PSYCHIATRY: No anxiety or depression.   MEDICATIONS AT HOME:   Prior to Admission  medications   Medication Sig Start Date End Date Taking? Authorizing Provider  Calcium Carbonate (CALCIUM 600 PO) Take 600 mg by mouth daily.   Yes [provider]  Cholecalciferol (VITAMIN D3) 50 MCG (2000 UT) TABS Take 4,000 Units by mouth daily.   Yes [provider]  dexlansoprazole (DEXILANT) 60 MG capsule Take 60-120 mg by mouth daily.    Yes [provider]  losartan-hydrochlorothiazide (HYZAAR) 50-12.5 MG tablet Take 1 tablet by mouth daily. 06/05/18  Yes [provider]  metFORMIN (GLUCOPHAGE) 500 MG tablet Take 500 mg by mouth 2 (two) times daily with a meal.    Yes [provider]  niacin 500 MG tablet Take 500 mg by mouth daily.   Yes [provider]  Omega 3 1000 MG CAPS Take 2,000 mg by mouth daily.   Yes [provider]  Probiotic Product (PROBIOTIC DAILY) CAPS Take 1 capsule by mouth daily.   Yes [provider]      VITAL SIGNS:  Blood pressure (!) 152/80, pulse 66, temperature 97.9 F (36.6 C), temperature source Oral, resp. rate 18, height 5\' 2"  (1.575 m), weight 111.1 kg, SpO2 100 %.  PHYSICAL EXAMINATION:  GENERAL:  68 y.o.-year-old patient lying in the bed with no acute distress.  EYES: Pupils equal, round, reactive to [place] and accommodation. No scleral icterus. Extraocular muscles intact.  HEENT: Head atraumatic, normocephalic. Oropharynx and nasopharynx clear.  NECK:  Supple, no jugular venous distention. No thyroid enlargement, no tenderness.  LUNGS: Normal breath sounds bilaterally, no wheezing, rales,rhonchi or crepitation. No use of accessory muscles of respiration.  CARDIOVASCULAR: S1, S2 normal. No murmurs, rubs, or gallops.  ABDOMEN: Soft, nontender, nondistended. Bowel sounds present. No organomegaly or mass.  EXTREMITIES: No pedal edema, cyanosis, or clubbing.  NEUROLOGIC: Cranial nerves II through XII are intact. Muscle strength 5/5 in all extremities. Sensation intact. Gait not  checked.  PSYCHIATRIC: The patient is alert and oriented x 3.  SKIN: No obvious rash, lesion, or ulcer.   LABORATORY PANEL:   CBC Recent Labs  Lab 07/01/18 1256  WBC 9.9  HGB 11.9*  HCT 37.9  PLT 351   ------------------------------------------------------------------------------------------------------------------  Chemistries  Recent Labs  Lab 07/01/18 1256  NA 139  K 3.8  CL 104  CO2 28  GLUCOSE 104*  BUN 17  CREATININE 0.57  CALCIUM 8.8*  AST 23  ALT 27  ALKPHOS 51  BILITOT 0.5   ------------------------------------------------------------------------------------------------------------------  Cardiac Enzymes No results for input(s): TROPONINI in the last 168 hours. ------------------------------------------------------------------------------------------------------------------  RADIOLOGY:  Ct Head Wo Contrast  Result Date: 07/01/2018 CLINICAL DATA:  Patient with altered mental status. Migraine headaches. EXAM: CT HEAD WITHOUT CONTRAST TECHNIQUE: Contiguous axial images were obtained from the base of the skull through the vertex without intravenous contrast. COMPARISON:  None. FINDINGS: Brain: No evidence of acute infarction, hemorrhage, hydrocephalus, extra-axial collection or mass lesion/mass effect. Ventricles and  sulci are appropriate for patient's age. Vascular: No hyperdense vessel or unexpected calcification. Skull: Intact Sinuses/Orbits: Paranasal sinuses are well aerated. Mastoid air cells are unremarkable. Other: None IMPRESSION: No acute intracranial process. Electronically Signed   By: Lovey Newcomer M.D.   On: 07/01/2018 13:32   US Carotid Bilateral (at [facility] Only)  Result Date: 07/01/2018 CLINICAL DATA:  Transient ischemic attack. EXAM: BILATERAL CAROTID DUPLEX ULTRASOUND TECHNIQUE: Pearline Cables scale imaging, color Doppler and duplex ultrasound were performed of bilateral carotid and vertebral arteries in the neck. COMPARISON:  None. FINDINGS: Criteria:  Quantification of carotid stenosis is based on velocity parameters that correlate the residual internal carotid diameter with NASCET-based stenosis levels, using the diameter of the distal internal carotid lumen as the denominator for stenosis measurement. The following velocity measurements were obtained: RIGHT ICA: 88 cm/sec CCA: 449 cm/sec SYSTOLIC ICA/CCA RATIO:  0.7 ECA: 124 cm/sec LEFT ICA: 75 cm/sec CCA: 80 cm/sec SYSTOLIC ICA/CCA RATIO:  0.9 ECA: 95 cm/sec RIGHT CAROTID ARTERY: Minimal intimal thickening/plaque formation in the carotid bulb and proximal internal carotid artery. RIGHT VERTEBRAL ARTERY:  Normal antegrade flow. LEFT CAROTID ARTERY: Minimal intimal thickening/plaque formation in the carotid bulb. LEFT VERTEBRAL ARTERY:  Normal antegrade flow. IMPRESSION: Minimal bilateral carotid artery intimal thickening/plaque formation without luminal stenosis or flow reduction. Electronically Signed   By: Claudie Revering M.D.   On: 07/01/2018 17:02    EKG:   Orders placed or performed during the hospital encounter of 07/01/18  . EKG 12-Lead  . EKG 12-Lead  . ED EKG  . ED EKG   EKG shows normal sinus rhythm at 73 bpm, no ST-T changes. IMPRESSION AND PLAN:  68 year old female patient with a history of essential hypertension, GERD, prediabetes comes in because of transient confusion, symptoms concerning for TIA. 1.  Acute right-sided numbness, confusion, symptoms concerning for TIA, initial CT head unremarkable.  Admitted to stroke unit under observation, check MRI of the brain, and ultrasound of carotids, echocardiogram, patient took Excedrin Migraine today, continue aspirin from tomorrow, check lipids.  Follow NIH stroke scale.  Sent symptoms completely resolved, does not have any confusion, right-sided numbness that she had at home this morning. 2.  Essential hypertension; continue her home BP medicines. 3.  GERD     All the records are reviewed and case discussed with ED  provider. Management plans discussed with the patient, family and they are in agreement.  CODE STATUS: Full code TOTAL TIME TAKING CARE OF THIS PATIENT: 55 minutes.    Epifanio Lesches M.D on 07/01/2018 at 5:13 PM  Between 7am to 6pm - Pager - [phone]  After 6pm go to [email or web address] - password [email or web address] [place] [facility]  Office  [phone]  CC: Primary care physician; Adin Hector, MD  Note: This dictation was prepared with Dragon dictation along with smaller phrase technology. Any transcriptional errors that result from this process are unintentional.

## 2018-07-01 NOTE — Plan of Care (Signed)
Pt admitted from the ED. VSS. Denies pain. NIH 1. Pt reported slight numbness to R side of face.

## 2018-07-01 NOTE — ED Triage Notes (Addendum)
Pt to ED via POV, pt states that she while she was getting ready this morning she felt like "nothing made sense". Pt states that she was trying to put her bra on but she couldn't make herself get it on and fasten it. Pt states that she then went to call a friend and couldn't process what she was trying to do. Pt states that this lasted about 5-10 minutes. Pt states that almost immediately after this episode she started to get a migraine, pt states that migraine has eased off after taking 2 Excedrin migraine pills. Pt states that she has hx/o migraines in the past. Pt states that migraine has eased off but now she is having numbness in the right cheek that started about 30 minutes PTA. Pt states that she just doesn't feel right and that she feels weak all over. Pt reports that the numbness is getting better.   LKW- 07/01/2018 First symptoms started 07/01/2018 at 0930.

## 2018-07-01 NOTE — ED Notes (Signed)
PT reports facial numbness subsiding, but still notes some minor change.  Pt states numbness to right thumb has totally subsided.

## 2018-07-01 NOTE — ED Provider Notes (Signed)
[facility] Emergency Department Provider Note  Time seen: 3:24 PM  I have reviewed the triage vital signs and the nursing notes.   HISTORY  Chief Complaint Numbness   HPI Jane Moran is a 68 y.o. female with a past medical history of arthritis, gastric reflux, migraines, hypertension, presents to the emergency department for right-sided numbness, confusion.  According to the patient around 9:00 this morning she was getting ready when she acutely became confused, could not figure out how to get her bra on.  Patient states she then tried to text a family member but could not figure out how to use her phone.  Patient states she has never had any issues with getting dressed or using her phone in the past.  States approximate hour after that the patient began having right-sided facial and hand numbness.  Did develop a moderate headache as well but has a history of migraines.  No history of complex/complicated migraines in the past.  By time patient arrived to the emergency department she states her symptoms have resolved.  Patient is feeling normal at this time.   Past Medical History:  Diagnosis Date  . Arthritis   . Cancer ([place])    skin  . Diffuse cystic mastopathy   . Dysrhythmia   . Esophageal reflux   . Family history of malignant neoplasm of breast   . H/O cystitis   . H/O endoscopy 2012  . Headache    migranes  . Hypertension 1990  . Pre-diabetes   . Screening cholesterol level   . Screening for obesity   . Special screening for malignant neoplasms, colon     Patient Active Problem List   Diagnosis Date Noted  . GERD (gastroesophageal reflux disease) 07/26/2012  . 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 Family history of breast cancer 07/26/2012  . Hypertension     Past Surgical History:  Procedure Laterality Date  . BREAST BIOPSY Left 2011   benign biopsy with Dr. Jamal Collin  . BREAST SURGERY     breast biopsy  . [place]  .  COLONOSCOPY  2007  . COLONOSCOPY WITH PROPOFOL N/A 11/13/2014   Procedure: COLONOSCOPY WITH PROPOFOL;  Surgeon: Josefine Class, MD;  Location: [facility] ENDOSCOPY;  Service: Endoscopy;  Laterality: N/A;  . COLONOSCOPY WITH PROPOFOL N/A 03/05/2018   Procedure: COLONOSCOPY WITH PROPOFOL;  Surgeon: Manya Silvas, MD;  Location: [facility] ENDOSCOPY;  Service: Endoscopy;  Laterality: N/A;  . CYST REMOVAL NECK  [id number]  . ESOPHAGOGASTRODUODENOSCOPY (EGD) WITH PROPOFOL N/A 12/16/2016   Procedure: ESOPHAGOGASTRODUODENOSCOPY (EGD) WITH PROPOFOL;  Surgeon: Manya Silvas, MD;  Location: [facility] ENDOSCOPY;  Service: Endoscopy;  Laterality: N/A;  . KNEE SURGERY Left 2011    Prior to Admission medications   Medication Sig Start Date End Date Taking? Authorizing Provider  aspirin-acetaminophen-caffeine (EXCEDRIN MIGRAINE) [phone] MG per tablet Take 1 tablet by mouth every 6 (six) hours as needed for pain.    [provider]  Cholecalciferol (VITAMIN D) 2000 UNITS CAPS Take 1 capsule by mouth daily. 400mg     [provider]  dexlansoprazole (DEXILANT) 60 MG capsule Take 60 mg by mouth daily.    [provider]  lisinopril-hydrochlorothiazide (PRINZIDE,ZESTORETIC) 20-25 MG per tablet Take 1 tablet by mouth daily.    [provider]  metFORMIN (GLUCOPHAGE) 500 MG tablet Take by mouth daily with breakfast.    [provider]  ranitidine (ZANTAC) 300 MG tablet Take 300 mg by mouth at bedtime.  [provider]  triamcinolone cream (KENALOG) 0.5 % Apply 1 application topically 3 (three) times daily.    [provider]    Allergies  Allergen Reactions  . Codeine Nausea Only  . Zinc Oxide Rash    Family History  Problem Relation Age of Onset  . Breast cancer Mother 3  . Lung cancer Father   . Prostate cancer Maternal Grandfather   . Breast cancer Paternal Aunt   . Breast cancer Cousin        paternal side    Social History Social  History   Tobacco Use  . Smoking status: Never Smoker  . Smokeless tobacco: Never Used  Substance Use Topics  . Alcohol use: No  . Drug use: No    Review of Systems Constitutional: Negative for fever. Eyes: Negative for visual complaints Cardiovascular: Negative for chest pain. Respiratory: Negative for shortness of breath. Gastrointestinal: Negative for abdominal pain Musculoskeletal: Negative for musculoskeletal complaints Neurological: Moderate headache, now resolved. All other ROS negative  ____________________________________________   PHYSICAL EXAM:  VITAL SIGNS: ED Triage Vitals [07/01/18 1253]  Enc Vitals Group     BP (!) 192/85     Pulse Rate 68     Resp 16     Temp 98.2 F (36.8 C)     Temp Source Oral     SpO2 98 %     Weight 245 lb (111.1 kg)     Height 5\' 2"  (1.575 m)     Head Circumference      Peak Flow      Pain Score 0     Pain Loc      Pain Edu?      Excl. in [place]?    Constitutional: Alert and oriented. Well appearing and in no distress. Eyes: Normal exam ENT   Head: Normocephalic and atraumatic.   Mouth/Throat: Mucous membranes are moist. Cardiovascular: Normal rate, regular rhythm.  Respiratory: Normal respiratory effort without tachypnea nor retractions. Breath sounds are clear Gastrointestinal: Soft and nontender. No distention.   Musculoskeletal: Nontender with normal range of motion in all extremities. Neurologic:  Normal speech and language. No gross focal neurologic deficits.  Equal grip strength bilaterally.  No pronator drift.  Finger-to-nose testing intact.  Cranial nerves intact.  Sensation intact and equal bilaterally in both extremities and face.  5/5 motor in all extremities. Skin:  Skin is warm, dry and intact.  Psychiatric: Mood and affect are normal.  ____________________________________________    EKG  EKG viewed and interpreted by myself shows a normal sinus rhythm at 73 bpm with a narrow QRS, normal axis, normal  intervals, no concerning ST changes.  ____________________________________________    RADIOLOGY  CT negative  ____________________________________________   INITIAL IMPRESSION / ASSESSMENT AND PLAN / ED COURSE  Pertinent labs & imaging results that were available during my care of the patient were reviewed by me and considered in my medical decision making (see chart for details).  Patient presents to the emergency department for symptoms concerning for TIA.  Patient differential would include TIA, CVA, complicated migraine.  Currently the patient appears well, normal neurological exam.  Normal physical exam.  Story is extremely concerning for TIA however.  Given the patient's very concerning history we will admit the patient to the hospital service for continued work-up.  Patient agreeable to plan of care.  Patient's work-up is reassuring at this time with a negative CT and labs.   NIH Stroke Scale   Interval: Baseline Time: 3:29  PM Person Administering Scale: Harvest Dark  Administer stroke scale items in the order listed. Record performance in each category after each subscale exam. Do not go back and change scores. Follow directions provided for each exam technique. Scores should reflect what the patient does, not what the clinician thinks the patient can do. The clinician should record answers while administering the exam and work quickly. Except where indicated, the patient should not be coached (i.e., repeated requests to patient to make a special effort).   1a  Level of consciousness: 0=alert; keenly responsive  1b. LOC questions:  0=Performs both tasks correctly  1c. LOC commands: 0=Performs both tasks correctly  2.  Best Gaze: 0=normal  3.  Visual: 0=No visual loss  4. Facial Palsy: 0=Normal symmetric movement  5a.  Motor left arm: 0=No drift, limb holds 90 (or 45) degrees for full 10 seconds  5b.  Motor right arm: 0=No drift, limb holds 90 (or 45) degrees for full  10 seconds  6a. motor left leg: 0=No drift, limb holds 90 (or 45) degrees for full 10 seconds  6b  Motor right leg:  0=No drift, limb holds 90 (or 45) degrees for full 10 seconds  7. Limb Ataxia: 0=Absent  8.  Sensory: 0=Normal; no sensory loss  9. Best Language:  0=No aphasia, normal  10. Dysarthria: 0=Normal  11. Extinction and Inattention: 0=No abnormality  12. Distal motor function: 0=Normal   Total:   0   ____________________________________________   FINAL CLINICAL IMPRESSION(S) / ED DIAGNOSES  Transient ischemic attack   Harvest Dark, MD 07/01/18 1530

## 2018-07-01 NOTE — ED Notes (Signed)
ED TO INPATIENT HANDOFF REPORT  ED Nurse Name and Phone #: Anderson Malta [phone]   S Name/Age/Gender Jane Moran 68 y.o. female Room/Bed: ED07A/ED07A  Code Status   Code Status: Full Code  Home/SNF/Other Home Patient oriented to: self, place, time and situation Is this baseline? Yes   Triage Complete: Triage complete  Chief Complaint Numbness  Triage Note Pt to ED via POV, pt states that she while she was getting ready this morning she felt like "nothing made sense". Pt states that she was trying to put her bra on but she couldn't make herself get it on and fasten it. Pt states that she then went to call a friend and couldn't process what she was trying to do. Pt states that this lasted about 5-10 minutes. Pt states that almost immediately after this episode she started to get a migraine, pt states that migraine has eased off after taking 2 Excedrin migraine pills. Pt states that she has hx/o migraines in the past. Pt states that migraine has eased off but now she is having numbness in the right cheek that started about 30 minutes PTA. Pt states that she just doesn't feel right and that she feels weak all over. Pt reports that the numbness is getting better.   LKW- 07/01/2018 First symptoms started 07/01/2018 at 0930.   Allergies Allergies  Allergen Reactions  . Codeine Nausea Only  . Zinc Oxide Rash    Level of Care/Admitting Diagnosis ED Disposition    ED Disposition Condition Comment   Admit  Hospital Area: [place] [[id number]]  Level of Care: Med-Surg [16]  Diagnosis: TIA (transient ischemic attack) [035009]  Admitting Physician: Epifanio Lesches [[id number]]  Attending Physician: Epifanio Lesches [[id number]]  Bed request comments: 1c  PT Class (Do Not Modify): Observation [104]  PT Acc Code (Do Not Modify): Observation [10022]       B Medical/Surgery History Past Medical History:  Diagnosis Date  . Arthritis   . Cancer ([place])    skin  .  Diffuse cystic mastopathy   . Dysrhythmia   . Esophageal reflux   . Family history of malignant neoplasm of breast   . H/O cystitis   . H/O endoscopy 2012  . Headache    migranes  . Hypertension 1990  . Pre-diabetes   . Screening cholesterol level   . Screening for obesity   . Special screening for malignant neoplasms, colon    Past Surgical History:  Procedure Laterality Date  . BREAST BIOPSY Left 2011   benign biopsy with Dr. Jamal Collin  . BREAST SURGERY     breast biopsy  . [place]  . COLONOSCOPY  2007  . COLONOSCOPY WITH PROPOFOL N/A 11/13/2014   Procedure: COLONOSCOPY WITH PROPOFOL;  Surgeon: Josefine Class, MD;  Location: [facility] ENDOSCOPY;  Service: Endoscopy;  Laterality: N/A;  . COLONOSCOPY WITH PROPOFOL N/A 03/05/2018   Procedure: COLONOSCOPY WITH PROPOFOL;  Surgeon: Manya Silvas, MD;  Location: [facility] ENDOSCOPY;  Service: Endoscopy;  Laterality: N/A;  . CYST REMOVAL NECK  [id number]  . ESOPHAGOGASTRODUODENOSCOPY (EGD) WITH PROPOFOL N/A 12/16/2016   Procedure: ESOPHAGOGASTRODUODENOSCOPY (EGD) WITH PROPOFOL;  Surgeon: Manya Silvas, MD;  Location: [facility] ENDOSCOPY;  Service: Endoscopy;  Laterality: N/A;  . KNEE SURGERY Left 2011     A IV Location/Drains/Wounds Patient Lines/Drains/Airways Status   Active Line/Drains/Airways    Name:   Placement date:   Placement time:   Site:   Days:   Peripheral IV 07/01/18 Right Antecubital  07/01/18    1532    Antecubital   less than 1   Airway   11/13/14    0910     1326          Intake/Output Last 24 hours No intake or output data in the 24 hours ending 07/01/18 1605  Labs/Imaging Results for orders placed or performed during the hospital encounter of 07/01/18 (from the past 48 hour(s))  Glucose, capillary     Status: Abnormal   Collection Time: 07/01/18 12:51 PM  Result Value Ref Range   Glucose-Capillary 102 (H) 70 - 99 mg/dL   Comment 1 Notify RN    Comment 2 Document in Chart   Protime-INR      Status: None   Collection Time: 07/01/18 12:56 PM  Result Value Ref Range   Prothrombin Time 12.1 11.4 - 15.2 seconds   INR 0.9 0.8 - 1.2    Comment: (NOTE) INR goal varies based on device and disease states. Performed at [facility], [address] [place], [place] [address]   APTT     Status: None   Collection Time: 07/01/18 12:56 PM  Result Value Ref Range   aPTT 26 24 - 36 seconds    Comment: Performed at [facility], [address] [place], [place] [address]  CBC     Status: Abnormal   Collection Time: 07/01/18 12:56 PM  Result Value Ref Range   WBC 9.9 4.0 - 10.5 K/uL   RBC 4.37 3.87 - 5.11 MIL/uL   Hemoglobin 11.9 (L) 12.0 - 15.0 g/dL   HCT 37.9 36.0 - 46.0 %   MCV 86.7 80.0 - 100.0 fL   MCH 27.2 26.0 - 34.0 pg   MCHC 31.4 30.0 - 36.0 g/dL   RDW 14.7 11.5 - 15.5 %   Platelets 351 150 - 400 K/uL   nRBC 0.0 0.0 - 0.2 %    Comment: Performed at [facility], [address] [place], [place] [address]  Differential     Status: None   Collection Time: 07/01/18 12:56 PM  Result Value Ref Range   Neutrophils Relative % 67 %   Neutro Abs 6.6 1.7 - 7.7 K/uL   Lymphocytes Relative 22 %   Lymphs Abs 2.2 0.7 - 4.0 K/uL   Monocytes Relative 8 %   Monocytes Absolute 0.8 0.1 - 1.0 K/uL   Eosinophils Relative 2 %   Eosinophils Absolute 0.2 0.0 - 0.5 K/uL   Basophils Relative 1 %   Basophils Absolute 0.1 0.0 - 0.1 K/uL   Immature Granulocytes 0 %   Abs Immature Granulocytes 0.03 0.00 - 0.07 K/uL    Comment: Performed at [facility], [address] [place],  [address]  Comprehensive metabolic panel     Status: Abnormal   Collection Time: 07/01/18 12:56 PM  Result Value Ref Range   Sodium 139 135 - 145 mmol/L   Potassium 3.8 3.5 - 5.1 mmol/L   Chloride 104 98 - 111 mmol/L   CO2 28 22 - 32 mmol/L   Glucose, Bld 104 (H) 70 - 99 mg/dL   BUN 17 8 - 23 mg/dL   Creatinine, Ser 0.57 0.44 - 1.00 mg/dL   Calcium 8.8 (L) 8.9 - 10.3  mg/dL   Total Protein 6.7 6.5 - 8.1 g/dL   Albumin 3.7 3.5 - 5.0 g/dL   AST 23 15 - 41 U/L   ALT 27 0 - 44 U/L   Alkaline Phosphatase 51  38 - 126 U/L   Total Bilirubin 0.5 0.3 - 1.2 mg/dL   GFR calc non Af Amer >60 >60 mL/min   GFR calc Af Amer >60 >60 mL/min   Anion gap 7 5 - 15    Comment: Performed at [facility], [place], [place] [address]   Ct Head Wo Contrast  Result Date: 07/01/2018 CLINICAL DATA:  Patient with altered mental status. Migraine headaches. EXAM: CT HEAD WITHOUT CONTRAST TECHNIQUE: Contiguous axial images were obtained from the base of the skull through the vertex without intravenous contrast. COMPARISON:  None. FINDINGS: Brain: No evidence of acute infarction, hemorrhage, hydrocephalus, extra-axial collection or mass lesion/mass effect. Ventricles and sulci are appropriate for patient's age. Vascular: No hyperdense vessel or unexpected calcification. Skull: Intact Sinuses/Orbits: Paranasal sinuses are well aerated. Mastoid air cells are unremarkable. Other: None IMPRESSION: No acute intracranial process. Electronically Signed   By: Lovey Newcomer M.D.   On: 07/01/2018 13:32    Pending Labs Unresulted Labs (From admission, onward)    Start     Ordered   07/08/18 0500  Creatinine, serum  (enoxaparin (LOVENOX)    CrCl >/= 30 ml/min)  Weekly,   STAT    Comments:  while on enoxaparin therapy    07/01/18 1558   07/02/18 0500  Hemoglobin A1c  Tomorrow morning,   STAT     07/01/18 1558   07/02/18 0500  Lipid panel  Tomorrow morning,   STAT    Comments:  Fasting    07/01/18 1558   07/01/18 1555  HIV antibody (Routine Testing)  Once,   STAT     07/01/18 1558   07/01/18 1555  CBC  (enoxaparin (LOVENOX)    CrCl >/= 30 ml/min)  Once,   STAT    Comments:  Baseline for enoxaparin therapy IF NOT ALREADY DRAWN.  Notify MD if PLT < 100 K.    07/01/18 1558   07/01/18 1555  Creatinine, serum  (enoxaparin (LOVENOX)    CrCl >/= 30 ml/min)  Once,   STAT     Comments:  Baseline for enoxaparin therapy IF NOT ALREADY DRAWN.    07/01/18 1558          Vitals/Pain Today's Vitals   07/01/18 1253 07/01/18 1532  BP: (!) 192/85 (!) 189/84  Pulse: 68 65  Resp: 16 18  Temp: 98.2 F (36.8 C)   TempSrc: Oral   SpO2: 98% 98%  Weight: 111.1 kg   Height: 5\' 2"  (1.575 m)   PainSc: 0-No pain     Isolation Precautions No active isolations  Medications Medications  sodium chloride flush (NS) 0.9 % injection 3 mL (3 mLs Intravenous Not Given 07/01/18 1522)  metFORMIN (GLUCOPHAGE) tablet 500 mg (has no administration in time range)  pantoprazole (PROTONIX) EC tablet 40 mg (has no administration in time range)  lisinopril-hydrochlorothiazide (PRINZIDE,ZESTORETIC) 20-25 MG per tablet 1 tablet (has no administration in time range)  Vitamin D CAPS 2,000 Units (has no administration in time range)   stroke: mapping our early stages of recovery book (has no administration in time range)  acetaminophen (TYLENOL) tablet 650 mg (has no administration in time range)    Or  acetaminophen (TYLENOL) solution 650 mg (has no administration in time range)    Or  acetaminophen (TYLENOL) suppository 650 mg (has no administration in time range)  enoxaparin (LOVENOX) injection 40 mg (has no administration in time range)  aspirin suppository 300 mg (has no administration in time range)  Or  aspirin tablet 325 mg (has no administration in time range)    Mobility walks Low fall risk   Focused Assessments Cardiac Assessment Handoff:    No results found for: CKTOTAL, CKMB, CKMBINDEX, TROPONINI No results found for: DDIMER Does the Patient currently have chest pain? No   , Neuro Assessment Handoff:  Swallow screen pass? Yes    NIH Stroke Scale ( + Modified Stroke Scale Criteria)  Interval: Initial Level of Consciousness (1a.)   : Alert, keenly responsive LOC Questions (1b. )   +: Answers both questions correctly LOC Commands (1c. )   + : Performs  both tasks correctly Best Gaze (2. )  +: Normal Visual (3. )  +: No visual loss Facial Palsy (4. )    : Normal symmetrical movements Motor Arm, Left (5a. )   +: No drift Motor Arm, Right (5b. )   +: No drift Motor Leg, Left (6a. )   +: No drift Motor Leg, Right (6b. )   +: No drift Limb Ataxia (7. ): Absent Sensory (8. )   +: Normal, no sensory loss Best Language (9. )   +: No aphasia Dysarthria (10. ): Normal Extinction/Inattention (11.)   +: No Abnormality Modified SS Total  +: 0 Complete NIHSS TOTAL: 0     Neuro Assessment: Within Defined Limits Neuro Checks:   Initial (07/01/18 1521)  Last Documented NIHSS Modified Score: 0 (07/01/18 1521) Has TPA been given? No If patient is a Neuro Trauma and patient is going to OR before floor call report to [place] nurse: [phone] or [phone]     R Recommendations: See Admitting Provider Note  Report given to:   Additional Notes:

## 2018-07-01 NOTE — Progress Notes (Signed)
Pt takes at home Hyzaar 50-12.5 mg daily.  Lisinopril 20mg  and hydrochlorothiazide 25mg  daily ordered on admission.  Notified Dr Jerelyn Charles of the above. Per MD to order BP meds/dose as pt takes at home.

## 2018-07-02 ENCOUNTER — Observation Stay
Admit: 2018-07-02 | Discharge: 2018-07-02 | Disposition: A | Discharge status: Home / Self Care | Payer: BLUE CROSS/BLUE SHIELD | Attending: Internal Medicine | Admitting: Internal Medicine

## 2018-07-02 DIAGNOSIS — Z23 Encounter for immunization: Secondary | ICD-10-CM | POA: Diagnosis not present

## 2018-07-02 DIAGNOSIS — R29700 NIHSS score 0: Secondary | ICD-10-CM | POA: Diagnosis present

## 2018-07-02 DIAGNOSIS — Z888 Allergy status to other drugs, medicaments and biological substances status: Secondary | ICD-10-CM | POA: Diagnosis not present

## 2018-07-02 DIAGNOSIS — I639 Cerebral infarction, unspecified: Secondary | ICD-10-CM | POA: Diagnosis present

## 2018-07-02 DIAGNOSIS — R200 Anesthesia of skin: Secondary | ICD-10-CM | POA: Diagnosis present

## 2018-07-02 DIAGNOSIS — M1990 Unspecified osteoarthritis, unspecified site: Secondary | ICD-10-CM | POA: Diagnosis present

## 2018-07-02 DIAGNOSIS — I63519 Cerebral infarction due to unspecified occlusion or stenosis of unspecified middle cerebral artery: Secondary | ICD-10-CM | POA: Diagnosis present

## 2018-07-02 DIAGNOSIS — R202 Paresthesia of skin: Secondary | ICD-10-CM

## 2018-07-02 DIAGNOSIS — Z85828 Personal history of other malignant neoplasm of skin: Secondary | ICD-10-CM | POA: Diagnosis not present

## 2018-07-02 DIAGNOSIS — G459 Transient cerebral ischemic attack, unspecified: Secondary | ICD-10-CM

## 2018-07-02 DIAGNOSIS — K219 Gastro-esophageal reflux disease without esophagitis: Secondary | ICD-10-CM | POA: Diagnosis present

## 2018-07-02 DIAGNOSIS — G4733 Obstructive sleep apnea (adult) (pediatric): Secondary | ICD-10-CM | POA: Diagnosis present

## 2018-07-02 DIAGNOSIS — Z6841 Body Mass Index (BMI) 40.0 and over, adult: Secondary | ICD-10-CM | POA: Diagnosis not present

## 2018-07-02 DIAGNOSIS — I10 Essential (primary) hypertension: Secondary | ICD-10-CM | POA: Diagnosis present

## 2018-07-02 DIAGNOSIS — M792 Neuralgia and neuritis, unspecified: Secondary | ICD-10-CM | POA: Diagnosis present

## 2018-07-02 DIAGNOSIS — R29701 NIHSS score 1: Secondary | ICD-10-CM | POA: Diagnosis not present

## 2018-07-02 DIAGNOSIS — G43909 Migraine, unspecified, not intractable, without status migrainosus: Secondary | ICD-10-CM | POA: Diagnosis present

## 2018-07-02 DIAGNOSIS — E669 Obesity, unspecified: Secondary | ICD-10-CM | POA: Diagnosis present

## 2018-07-02 DIAGNOSIS — E119 Type 2 diabetes mellitus without complications: Secondary | ICD-10-CM | POA: Diagnosis present

## 2018-07-02 DIAGNOSIS — E785 Hyperlipidemia, unspecified: Secondary | ICD-10-CM | POA: Diagnosis present

## 2018-07-02 DIAGNOSIS — Z885 Allergy status to narcotic agent status: Secondary | ICD-10-CM | POA: Diagnosis not present

## 2018-07-02 DIAGNOSIS — Z7984 Long term (current) use of oral hypoglycemic drugs: Secondary | ICD-10-CM | POA: Diagnosis not present

## 2018-07-02 DIAGNOSIS — I480 Paroxysmal atrial fibrillation: Secondary | ICD-10-CM | POA: Diagnosis present

## 2018-07-02 DIAGNOSIS — Z79899 Other long term (current) drug therapy: Secondary | ICD-10-CM | POA: Diagnosis not present

## 2018-07-02 DIAGNOSIS — I4891 Unspecified atrial fibrillation: Secondary | ICD-10-CM | POA: Diagnosis not present

## 2018-07-02 LAB — GLUCOSE, CAPILLARY
Glucose-Capillary: 97 mg/dL (ref 70–99)
Glucose-Capillary: 102 mg/dL — ABNORMAL HIGH (ref 70–99)
Glucose-Capillary: 100 mg/dL — ABNORMAL HIGH (ref 70–99)
Glucose-Capillary: 92 mg/dL (ref 70–99)

## 2018-07-02 LAB — ECHOCARDIOGRAM COMPLETE
Height: 62 in
Weight: 3920 oz

## 2018-07-02 LAB — LIPID PANEL
Cholesterol: 201 mg/dL — ABNORMAL HIGH (ref 0–200)
HDL: 47 mg/dL (ref >40)
LDL Cholesterol: 92 mg/dL (ref 0–99)
Total CHOL/HDL Ratio: 4.3 RATIO
Triglycerides: 310 mg/dL — ABNORMAL HIGH (ref <150)
VLDL: 62 mg/dL — AB (ref 0–40)

## 2018-07-02 LAB — HEMOGLOBIN A1C
Hgb A1c MFr Bld: 6.3 % — ABNORMAL HIGH (ref 4.8–5.6)
Mean Plasma Glucose: 134.11 mg/dL

## 2018-07-02 LAB — TROPONIN I
Troponin I: <0.03 ng/mL (ref <0.03)
Troponin I: <0.03 ng/mL (ref <0.03)

## 2018-07-02 MED ORDER — PERFLUTREN LIPID MICROSPHERE
1.0000 mL | INTRAVENOUS | Status: AC | PRN
  Administered 2018-07-02: 2 mL via INTRAVENOUS
  Filled 2018-07-02: qty 10

## 2018-07-02 MED ORDER — OMEGA-3-ACID ETHYL ESTERS 1 G PO CAPS
2.0000 g | ORAL_CAPSULE | Freq: Every day | ORAL | Status: DC
  Administered 2018-07-02 – 2018-07-03 (×2): 2 g via ORAL
  Filled 2018-07-02 (×3): qty 2

## 2018-07-02 MED ORDER — METOPROLOL TARTRATE 25 MG PO TABS
25.0000 mg | ORAL_TABLET | Freq: Two times a day (BID) | ORAL | Status: DC
  Administered 2018-07-02 – 2018-07-03 (×2): 25 mg via ORAL
  Filled 2018-07-02 (×2): qty 1

## 2018-07-02 MED ORDER — APIXABAN 5 MG PO TABS: 5.0000 mg | ORAL_TABLET | Freq: Two times a day (BID) | ORAL | Status: DC

## 2018-07-02 MED ORDER — ONDANSETRON HCL 4 MG/2ML IJ SOLN: 4.0000 mg | INTRAMUSCULAR | Status: DC | PRN

## 2018-07-02 MED ORDER — METFORMIN HCL 500 MG PO TABS
500.0000 mg | ORAL_TABLET | Freq: Two times a day (BID) | ORAL | Status: DC
  Administered 2018-07-02 – 2018-07-03 (×2): 500 mg via ORAL
  Filled 2018-07-02 (×3): qty 1

## 2018-07-02 MED ORDER — ATORVASTATIN CALCIUM 20 MG PO TABS
20.0000 mg | ORAL_TABLET | Freq: Every day | ORAL | Status: DC
  Administered 2018-07-02: 20 mg via ORAL
  Filled 2018-07-02: qty 1

## 2018-07-02 NOTE — Consult Note (Addendum)
Referring Physician: Fritzi Mandes MD    Chief Complaint: Right facial and right thumb numbness, confusion  HPI: Jane Moran is an 68 y.o. female with past medical history of hypertension, migraine headaches with aura, prediabetes, skin cancer, and dysrhythmia presenting to the ED on 07/01/2018 right facial numbness and right hand numbness.  Patient states that while she was getting ready yesterday morning she felt like her "hands were not coordinating well and nothing seemed to make sense".  Patient states that she was trying to put her bra on but she could not make herself get it on and functioning.  She then went to call a friend and could not process what she was trying to do or how to make the phone call.  Symptoms lasted about 5 to 10 minutes then immediately after she started to have a migraine headache which is stopped after taking 2 Excedrin Migraine pills.  She thought her symptoms were likely due to her migraine headaches however 2 hours into her symptoms she developed sudden numbness in her right cheek and right thumb.  Denies associated altered sensorium, speech abnormality, cranial nerve deficit, seizures, focal motor deficits, diplopia, nausea or vomiting, dizziness, syncope or LOC.  Arrival to the ED symptoms had almost resolved her initial NIH stroke scale 1.  Noncontrast CT head was obtained and showed no acute intracranial abnormality.  Follow-up MRI of the brain showed small acute infarcts in the posterior left MCA territory.  Possible small chronic infarcts in the cerebellum, chronic microhemorrhage in the left inferior frontal gyrus. Patient was admitted for further stroke work-up and management.  Date last known well: Date: 07/01/2018 Time last known well: Time: 09:30 tPA Given: No: Outside window period, resolving symptoms  Past Medical History:  Diagnosis Date  . Arthritis   . Cancer ([place])    skin  . Diffuse cystic mastopathy   . Dysrhythmia   . Esophageal reflux   . Family  history of malignant neoplasm of breast   . H/O cystitis   . H/O endoscopy 2012  . Headache    migranes  . Hypertension 1990  . Pre-diabetes   . Screening cholesterol level   . Screening for obesity   . Special screening for malignant neoplasms, colon     Past Surgical History:  Procedure Laterality Date  . BREAST BIOPSY Left 2011   benign biopsy with Dr. Jamal Collin  . BREAST SURGERY     breast biopsy  . [place]  . COLONOSCOPY  2007  . COLONOSCOPY WITH PROPOFOL N/A 11/13/2014   Procedure: COLONOSCOPY WITH PROPOFOL;  Surgeon: Josefine Class, MD;  Location: [facility] ENDOSCOPY;  Service: Endoscopy;  Laterality: N/A;  . COLONOSCOPY WITH PROPOFOL N/A 03/05/2018   Procedure: COLONOSCOPY WITH PROPOFOL;  Surgeon: Manya Silvas, MD;  Location: [facility] ENDOSCOPY;  Service: Endoscopy;  Laterality: N/A;  . CYST REMOVAL NECK  [id number]  . ESOPHAGOGASTRODUODENOSCOPY (EGD) WITH PROPOFOL N/A 12/16/2016   Procedure: ESOPHAGOGASTRODUODENOSCOPY (EGD) WITH PROPOFOL;  Surgeon: Manya Silvas, MD;  Location: [facility] ENDOSCOPY;  Service: Endoscopy;  Laterality: N/A;  . KNEE SURGERY Left 2011    Family History  Problem Relation Age of Onset  . Breast cancer Mother 49  . Lung cancer Father   . Prostate cancer Maternal Grandfather   . Breast cancer Paternal Aunt   . Breast cancer Cousin        paternal side   Social History:  reports that she has never smoked. She has never used smokeless tobacco.  She reports that she does not drink alcohol or use drugs.  Allergies:  Allergies  Allergen Reactions  . Codeine Nausea Only  . Zinc Oxide Rash    Medications:  I have reviewed the patient's current medications. Prior to Admission:  Medications Prior to Admission  Medication Sig Dispense Refill Last Dose  . Calcium Carbonate (CALCIUM 600 PO) Take 600 mg by mouth daily.   06/30/2018 at Unknown time  . Cholecalciferol (VITAMIN D3) 50 MCG (2000 UT) TABS Take 4,000 Units by mouth daily.    06/30/2018 at Unknown time  . dexlansoprazole (DEXILANT) 60 MG capsule Take 60-120 mg by mouth daily.    07/01/2018 at 0830  . losartan-hydrochlorothiazide (HYZAAR) 50-12.5 MG tablet Take 1 tablet by mouth daily.   07/01/2018 at 0830  . metFORMIN (GLUCOPHAGE) 500 MG tablet Take 500 mg by mouth 2 (two) times daily with a meal.    07/01/2018 at 0830  . niacin 500 MG tablet Take 500 mg by mouth daily.   06/30/2018 at Unknown time  . Omega 3 1000 MG CAPS Take 2,000 mg by mouth daily.   06/30/2018 at Unknown time  . Probiotic Product (PROBIOTIC DAILY) CAPS Take 1 capsule by mouth daily.   06/30/2018 at Unknown time   Scheduled: . aspirin  300 mg Rectal Daily   Or  . aspirin  325 mg Oral Daily  . atorvastatin  20 mg Oral q1800  . cholecalciferol  2,000 Units Oral Daily  . enoxaparin (LOVENOX) injection  40 mg Subcutaneous Q12H  . losartan  50 mg Oral Daily   And  . hydrochlorothiazide  12.5 mg Oral Daily  . insulin aspart  0-9 Units Subcutaneous TID WC  . metFORMIN  500 mg Oral BID WC  . metoprolol tartrate  25 mg Oral BID  . omega-3 acid ethyl esters  2 g Oral Daily  . pantoprazole  40 mg Oral Daily  . sodium chloride flush  3 mL Intravenous Once    ROS: History obtained from the patient   General ROS: negative for - chills, fatigue, fever, night sweats, weight gain or weight loss Psychological ROS: negative for - behavioral disorder, hallucinations, memory difficulties, mood swings or suicidal ideation Ophthalmic ROS: negative for - blurry vision, double vision, eye pain or loss of vision ENT ROS: negative for - epistaxis, nasal discharge, oral lesions, sore throat, tinnitus or vertigo Allergy and Immunology ROS: negative for - hives or itchy/watery eyes Hematological and Lymphatic ROS: negative for - bleeding problems, bruising or swollen lymph nodes Endocrine ROS: negative for - galactorrhea, hair pattern changes, polydipsia/polyuria or temperature intolerance Respiratory ROS: negative for  - cough, hemoptysis, shortness of breath or wheezing Cardiovascular ROS: negative for - chest pain, dyspnea on exertion, edema or irregular heartbeat Gastrointestinal ROS: negative for - abdominal pain, diarrhea, hematemesis, nausea/vomiting or stool incontinence Genito-Urinary ROS: negative for - dysuria, hematuria, incontinence or urinary frequency/urgency Musculoskeletal ROS: negative for - joint swelling or muscular weakness Neurological ROS: as noted in HPI Dermatological ROS: negative for rash and skin lesion changes  Physical Examination: Blood pressure 116/73, pulse 74, temperature 98.2 F (36.8 C), temperature source Oral, resp. rate 18, height 5\' 2"  (1.575 m), weight 111.1 kg, SpO2 95 %.   HEENT-  Normocephalic, no lesions, without obvious abnormality.  Normal external eye and conjunctiva.  Normal TM's bilaterally.  Normal auditory canals and external ears. Normal external nose, mucus membranes and septum.  Normal pharynx. Cardiovascular- S1, S2 normal, pulses palpable throughout   Lungs-  chest clear, no wheezing, rales, normal symmetric air entry Abdomen- soft, non-tender; bowel sounds normal; no masses,  no organomegaly Extremities- no edema Lymph-no adenopathy palpable Musculoskeletal-no joint tenderness, deformity or swelling Skin-warm and dry, no hyperpigmentation, vitiligo, or suspicious lesions  Neurological Exam   Mental Status: Alert, oriented, thought content appropriate.  Speech fluent without evidence of aphasia.  Able to follow 3 step commands without difficulty. Attention span and concentration seemed appropriate  Cranial Nerves: II: Discs flat bilaterally; Visual fields grossly normal, pupils equal, round, reactive to Reep and accommodation III,IV, VI: ptosis not present, extra-ocular motions intact bilaterally V,VII: smile symmetric, facial Faulstich touch sensation intact VIII: hearing normal bilaterally IX,X: gag reflex present XI: bilateral shoulder  shrug XII: midline tongue extension Motor: Right :  Upper extremity   5/5 Without pronator drift      Left: Upper extremity   5/5 without pronator drift Right:   Lower extremity   5/5                                          Left: Lower extremity   5/5 Tone and bulk:normal tone throughout; no atrophy noted Sensory: Pinprick and Baldyga touch intact bilaterally Deep Tendon Reflexes: 2+ and symmetric throughout Plantars: Right: downgoing                              Left: downgoing Cerebellar: Finger-to-nose testing intact bilaterally. Heel to shin testing normal bilaterally Gait: not tested due to safety concerns  Data Reviewed  Laboratory Studies:  Basic Metabolic Panel: Recent Labs  Lab 07/01/18 1256  NA 139  K 3.8  CL 104  CO2 28  GLUCOSE 104*  BUN 17  CREATININE 0.57  CALCIUM 8.8*    Liver Function Tests: Recent Labs  Lab 07/01/18 1256  AST 23  ALT 27  ALKPHOS 51  BILITOT 0.5  PROT 6.7  ALBUMIN 3.7   No results for input(s): LIPASE, AMYLASE in the last 168 hours. No results for input(s): AMMONIA in the last 168 hours.  CBC: Recent Labs  Lab 07/01/18 1256  WBC 9.9  NEUTROABS 6.6  HGB 11.9*  HCT 37.9  MCV 86.7  PLT 351    Cardiac Enzymes: Recent Labs  Lab 07/01/18 2103 07/02/18 0235 07/02/18 0934  TROPONINI <0.03 <0.03 <0.03    BNP: Invalid input(s): POCBNP  CBG: Recent Labs  Lab 07/01/18 1251 07/01/18 1730 07/02/18 0800 07/02/18 1216  GLUCAP 102* 83 97 102*    Microbiology: No results found for this or any previous visit.  Coagulation Studies: Recent Labs    07/01/18 1256  LABPROT 12.1  INR 0.9    Urinalysis: No results for input(s): COLORURINE, LABSPEC, PHURINE, GLUCOSEU, HGBUR, BILIRUBINUR, KETONESUR, PROTEINUR, UROBILINOGEN, NITRITE, LEUKOCYTESUR in the last 168 hours.  Invalid input(s): APPERANCEUR  Lipid Panel:    Component Value Date/Time   CHOL 201 (H) 07/02/2018 0235   TRIG 310 (H) 07/02/2018 0235   HDL 47  07/02/2018 0235   CHOLHDL 4.3 07/02/2018 0235   VLDL 62 (H) 07/02/2018 0235   LDLCALC 92 07/02/2018 0235    HgbA1C:  Lab Results  Component Value Date   HGBA1C 6.3 (H) 07/02/2018    Urine Drug Screen:  No results found for: LABOPIA, COCAINSCRNUR, LABBENZ, AMPHETMU, THCU, LABBARB  Alcohol Level: No results for input(s): ETH in the last  168 hours.  Other results: EKG: normal EKG, normal sinus rhythm, unchanged from previous tracings.  Imaging: Ct Head Wo Contrast  Result Date: 07/01/2018 CLINICAL DATA:  Patient with altered mental status. Migraine headaches. EXAM: CT HEAD WITHOUT CONTRAST TECHNIQUE: Contiguous axial images were obtained from the base of the skull through the vertex without intravenous contrast. COMPARISON:  None. FINDINGS: Brain: No evidence of acute infarction, hemorrhage, hydrocephalus, extra-axial collection or mass lesion/mass effect. Ventricles and sulci are appropriate for patient's age. Vascular: No hyperdense vessel or unexpected calcification. Skull: Intact Sinuses/Orbits: Paranasal sinuses are well aerated. Mastoid air cells are unremarkable. Other: None IMPRESSION: No acute intracranial process. Electronically Signed   By: Lovey Newcomer M.D.   On: 07/01/2018 13:32   Mr Brain Wo Contrast  Result Date: 07/01/2018 CLINICAL DATA:  68 year old female with right side numbness and confusion. EXAM: MRI HEAD WITHOUT CONTRAST TECHNIQUE: Multiplanar, multiecho pulse sequences of the brain and surrounding structures were obtained without intravenous contrast. COMPARISON:  Head CT earlier today. FINDINGS: Brain: There is small area of cortical restricted diffusion in the posterior left parietal lobe on series 2, image 37. There is suggestion of faint abnormal diffusion in the posterior left frontal lobe on image 39. No other restricted diffusion identified. Faint associated T2 and FLAIR hyperintensity at both areas (series 12, images 18 and 19). No mass effect. No acute  intracranial hemorrhage identified. Chronic microhemorrhage suspected in the anterior left frontal lobe on series 10, image 34. No midline shift, mass effect, evidence of mass lesion, ventriculomegaly, extra-axial collection. Cervicomedullary junction and pituitary are within normal limits. No cortical encephalomalacia identified. Small chronic lacunar type infarct of the left cerebellum suspected on series 7, image 8. Possible similar chronic right side lacune on image 6. Deep gray matter nuclei and brainstem within normal limits. Vascular: Major intracranial vascular flow voids are preserved. Skull and upper cervical spine: Negative for age visible cervical spine. Normal bone marrow signal. Sinuses/Orbits: Negative. Other: Mastoids are clear. Scalp and face soft tissues appear negative. IMPRESSION: 1. Small acute infarcts in the posterior left MCA territory. No associated hemorrhage or mass effect. 2. No other acute intracranial abnormality. Possible small chronic infarcts in the cerebellum, chronic microhemorrhage in the left inferior frontal gyrus. Electronically Signed   By: Genevie Ann M.D.   On: 07/01/2018 18:45   US Carotid Bilateral (at [facility] And [facility] Only)  Result Date: 07/01/2018 CLINICAL DATA:  Transient ischemic attack. EXAM: BILATERAL CAROTID DUPLEX ULTRASOUND TECHNIQUE: Pearline Cables scale imaging, color Doppler and duplex ultrasound were performed of bilateral carotid and vertebral arteries in the neck. COMPARISON:  None. FINDINGS: Criteria: Quantification of carotid stenosis is based on velocity parameters that correlate the residual internal carotid diameter with NASCET-based stenosis levels, using the diameter of the distal internal carotid lumen as the denominator for stenosis measurement. The following velocity measurements were obtained: RIGHT ICA: 88 cm/sec CCA: 128 cm/sec SYSTOLIC ICA/CCA RATIO:  0.7 ECA: 124 cm/sec LEFT ICA: 75 cm/sec CCA: 80 cm/sec SYSTOLIC ICA/CCA RATIO:  0.9 ECA: 95 cm/sec RIGHT  CAROTID ARTERY: Minimal intimal thickening/plaque formation in the carotid bulb and proximal internal carotid artery. RIGHT VERTEBRAL ARTERY:  Normal antegrade flow. LEFT CAROTID ARTERY: Minimal intimal thickening/plaque formation in the carotid bulb. LEFT VERTEBRAL ARTERY:  Normal antegrade flow. IMPRESSION: Minimal bilateral carotid artery intimal thickening/plaque formation without luminal stenosis or flow reduction. Electronically Signed   By: Claudie Revering M.D.   On: 07/01/2018 17:02    Assessment: 68 y.o. female with past medical history  of hypertension, migraine headaches with aura, prediabetes, skin cancer, and dysrhythmia presenting to the ED on 07/01/2018 right facial numbness and right hand numbness.  MRI of the brain reviewed and shows small acute infarcts in the posterior left MCA territory, possible small chronic infarcts in the cerebellum, chronic microhemorrhage in the left inferior frontal gyrus also noted.  Etiology likely embolic in the setting of new onset atrial fibrillation noted on telemetry.  Echocardiogram did not show cardiac source of emboli.  Ultrasound carotid negative for hemodynamically significant stenosis.  Hemoglobin A1c 6.3, LDL 92.  Patient reports that she was not on any antiplatelet or anticoagulation prior to this episode.  Stroke Risk Factors - family history, hyperlipidemia and hypertension  Plan: 1. Continue Aspirin 325 mg. Agree with cardiology's recommendations to start Eliquis. Will start Eliquis dosing tomorrow. 2. Statin with goal low density lipoprotein (LDL) <70 mg/dl, and lifestyle modification 3. PT/OT/Speech to evaluate 4. Neuro checks per protocol 5 Telemetry monitoring   This patient was staffed with Dr. Irish Elders, Alease Frame who personally evaluated patient, reviewed documentation and agreed with assessment and plan of care as above.  Rufina Falco, DNP, FNP-BC Board certified Nurse Practitioner Neurology Department   07/02/2018, 1:54  PM

## 2018-07-02 NOTE — Evaluation (Signed)
Physical Therapy Evaluation Patient Details Name: Jane Moran MRN: [id number] DOB: 13-Dec-1950 Today's Date: 07/02/2018   History of Present Illness  FELMA PFEFFERLE is a 68 y.o. female who presented to the ED complaining of right sided numbness and acute confusion. Pt stated while getting ready she could not figure out how to put on her bra or work her phone. MRI + for "small acute infarcts in the posterior left MCA territory". PMH includes: arthritis, gastric reflux, migraines, & hypertension.  Clinical Impression  Upon evaluation, patient alert and oriented; follows commands and eager for OOB mobility.  Denies pain; reports all presenting symptoms have fully resolved, feels back to baseline at this time.  Bilat UE/LE strength and ROM grossly symmetrical and WFL; no focal weakness, sensory or coordination deficits noted.  Able to complete bed mobility, sit/stand, basic transfers, gait (300') with stairs (up/down 6 with rail), mod indep.  Good cadence, gait speed (indicative of community-ambulatory); good comfort and overall control of all mobility efforts. Appears to be at baseline level of functional activity; no indication for acute PT at this time.  Will complete initial order; please re-consult should needs change.    Follow Up Recommendations No PT follow up    Equipment Recommendations       Recommendations for Other Services       Precautions / Restrictions Precautions Precautions: Fall Restrictions Weight Bearing Restrictions: No      Mobility  Bed Mobility Overal bed mobility: Independent                Transfers Overall transfer level: Independent Equipment used: None             General transfer comment: Good LE strength/power, good balance, control and overall safety/stability with transfer  Ambulation/Gait Ambulation/Gait assistance: Modified independent (Device/Increase time) Gait Distance (Feet): 300 Feet Assistive device: None   Gait velocity:  10' walk time, 6-7 seconds   General Gait Details: reciprocal stepping pattern with good step height/length, symmetrical weight shift and overall gait mechanics.  Complete dynamic gait components wihtout difficulty, LOB or safety concern  Stairs Stairs: Yes Stairs assistance: Modified independent (Device/Increase time)   Number of Stairs: 6 General stair comments: step to gait pattern, favoring R knee; good control and balance, good safety awareness/insight  Wheelchair Mobility    Modified Rankin (Stroke Patients Only)       Balance Overall balance assessment: Modified Independent(standing functional reach >6")                                           Pertinent Vitals/Pain Pain Assessment: No/denies pain    Home Living Family/patient expects to be discharged to:: Private residence Living Arrangements: Spouse/significant other;Children Available Help at Discharge: Family;Neighbor;Available 24 hours/day Type of Home: House Home Access: Stairs to enter Entrance Stairs-Rails: Left Entrance Stairs-Number of Steps: 3-4 Home Layout: One level Home Equipment: Shower seat;Cane - single point Additional Comments: SPC available but pt not currently using.     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; intermittent use of SPC when R knee "bothering me"     Hand Dominance   Dominant Hand: Right    Extremity/Trunk Assessment   Upper Extremity Assessment Upper Extremity Assessment: Overall WFL for tasks assessed  Lower Extremity Assessment Lower Extremity Assessment: Overall WFL for tasks assessed(grossly 4+/5 throughout, no focal weakness, sensory or coordination deificits noted)    Cervical / Trunk Assessment Cervical / Trunk Assessment: Normal  Communication   Communication: No difficulties  Cognition Arousal/Alertness:  Awake/alert Behavior During Therapy: WFL for tasks assessed/performed Overall Cognitive Status: Within Functional Limits for tasks assessed                                 General Comments: Pt A&O x4; alert; in good spirits at time of evaluation. Endorses symptoms have resolved.       General Comments General comments (skin integrity, edema, etc.): Pt endorses using SPC on occasion when pain in knees increases. Otherwise is independent in all areas of ADL/IADL. Endorses return to baseline level of sensation in the right side. No difficulty with motor planning noted throughout evaluation.    Exercises     Assessment/Plan    PT Assessment Patent does not need any further PT services  PT Problem List         PT Treatment Interventions      PT Goals (Current goals can be found in the Care Plan section)  Acute Rehab PT Goals Patient Stated Goal: To go home PT Goal Formulation: All assessment and education complete, DC therapy Time For Goal Achievement: 07/02/18 Potential to Achieve Goals: Good    Frequency     Barriers to discharge        Co-evaluation               AM-PAC PT "6 Clicks" Mobility  Outcome Measure Help needed turning from your back to your side while in a flat bed without using bedrails?: None Help needed moving from lying on your back to sitting on the side of a flat bed without using bedrails?: None Help needed moving to and from a bed to a chair (including a wheelchair)?: None Help needed standing up from a chair using your arms (e.g., wheelchair or bedside chair)?: None Help needed to walk in hospital room?: None Help needed climbing 3-5 steps with a railing? : None 6 Click Score: 24    End of Session Equipment Utilized During Treatment: Gait belt Activity Tolerance: Patient tolerated treatment well Patient left: in chair;with call bell/phone within reach(alarm not required) Nurse Communication: Mobility status PT Visit Diagnosis:  Difficulty in walking, not elsewhere classified (R26.2)    Time: 1610-9604 PT Time Calculation (min) (ACUTE ONLY): 15 min   Charges:   PT Evaluation $PT Eval Moderate Complexity: 1 Mod          Drayton Tieu H. Owens Shark, PT, DPT, NCS 07/02/18, 10:31 AM [phone]

## 2018-07-02 NOTE — Plan of Care (Signed)
  Problem: Education: Goal: Knowledge of secondary prevention will improve Outcome: Progressing   Problem: Education: Goal: Knowledge of patient specific risk factors addressed and post discharge goals established will improve Outcome: Progressing   Problem: Ischemic Stroke/TIA Tissue Perfusion: Goal: Complications of ischemic stroke/TIA will be minimized Outcome: Progressing   Problem: Education: Goal: Knowledge of General Education information will improve Description Including pain rating scale, medication(s)/side effects and non-pharmacologic comfort measures Outcome: Progressing   Problem: Clinical Measurements: Goal: Ability to maintain clinical measurements within normal limits will improve Outcome: Progressing

## 2018-07-02 NOTE — Consult Note (Signed)
[facility] Consultation Note  Patient ID: Jane Moran, MRN: [id number], DOB/AGE: 66-May-1952 65 y.o. Admit date: 07/01/2018   Date of Consult: 07/02/2018 Primary Physician: Adin Hector, MD Primary Cardiologist: None  Chief Complaint:  Chief Complaint  Patient presents with  . Numbness   Reason for Consult: Atrial fibrillation with stroke  HPI: 68 y.o. female with known essential hypertension and borderline hyperlipidemia having good control of these issues over the last several years.  The patient recently has had some palpitations and irregular heartbeat possibly consistent with supraventricular tachycardia and/or PVCs.  With this increase in frequency of her Tatian's she then had a numbness and tingling of her right arm as well as thumb and right face.  This was consistent with a TIA.  This resolved with the appropriate medication management and has not returned.  She has had intermittent episodes of atrial fibrillation by telemetry since she has been here in the hospital but currently her EKG shows normal sinus rhythm otherwise normal EKG.  She feels well at this time and a recent MRI and echocardiogram have been performed although pending  Past Medical History:  Diagnosis Date  . Arthritis   . Cancer ([place])    skin  . Diffuse cystic mastopathy   . Dysrhythmia   . Esophageal reflux   . Family history of malignant neoplasm of breast   . H/O cystitis   . H/O endoscopy 2012  . Headache    migranes  . Hypertension 1990  . Pre-diabetes   . Screening cholesterol level   . Screening for obesity   . Special screening for malignant neoplasms, colon       Surgical History:  Past Surgical History:  Procedure Laterality Date  . BREAST BIOPSY Left 2011   benign biopsy with Dr. Jamal Collin  . BREAST SURGERY     breast biopsy  . [place]  . COLONOSCOPY  2007  . COLONOSCOPY WITH PROPOFOL N/A 11/13/2014   Procedure: COLONOSCOPY WITH PROPOFOL;  Surgeon: Josefine Class, MD;  Location: [facility] ENDOSCOPY;  Service: Endoscopy;  Laterality: N/A;  . COLONOSCOPY WITH PROPOFOL N/A 03/05/2018   Procedure: COLONOSCOPY WITH PROPOFOL;  Surgeon: Manya Silvas, MD;  Location: [facility] ENDOSCOPY;  Service: Endoscopy;  Laterality: N/A;  . CYST REMOVAL NECK  [id number]  . ESOPHAGOGASTRODUODENOSCOPY (EGD) WITH PROPOFOL N/A 12/16/2016   Procedure: ESOPHAGOGASTRODUODENOSCOPY (EGD) WITH PROPOFOL;  Surgeon: Manya Silvas, MD;  Location: [facility] ENDOSCOPY;  Service: Endoscopy;  Laterality: N/A;  . KNEE SURGERY Left 2011     Home Meds: Prior to Admission medications   Medication Sig Start Date End Date Taking? Authorizing Provider  Calcium Carbonate (CALCIUM 600 PO) Take 600 mg by mouth daily.   Yes [provider]  Cholecalciferol (VITAMIN D3) 50 MCG (2000 UT) TABS Take 4,000 Units by mouth daily.   Yes [provider]  dexlansoprazole (DEXILANT) 60 MG capsule Take 60-120 mg by mouth daily.    Yes [provider]  losartan-hydrochlorothiazide (HYZAAR) 50-12.5 MG tablet Take 1 tablet by mouth daily. 06/05/18  Yes [provider]  metFORMIN (GLUCOPHAGE) 500 MG tablet Take 500 mg by mouth 2 (two) times daily with a meal.    Yes [provider]  niacin 500 MG tablet Take 500 mg by mouth daily.   Yes [provider]  Omega 3 1000 MG CAPS Take 2,000 mg by mouth daily.   Yes [provider]  Probiotic Product (PROBIOTIC DAILY) CAPS Take 1  capsule by mouth daily.   Yes [provider]    Inpatient Medications:  . apixaban  5 mg Oral BID  . aspirin  300 mg Rectal Daily   Or  . aspirin  325 mg Oral Daily  . atorvastatin  20 mg Oral q1800  . cholecalciferol  2,000 Units Oral Daily  . enoxaparin (LOVENOX) injection  40 mg Subcutaneous Q12H  . losartan  50 mg Oral Daily   And  . hydrochlorothiazide  12.5 mg Oral Daily  . insulin aspart  0-9 Units Subcutaneous TID WC  . metFORMIN  500 mg Oral BID WC  .  metoprolol tartrate  25 mg Oral BID  . omega-3 acid ethyl esters  2 g Oral Daily  . pantoprazole  40 mg Oral Daily  . sodium chloride flush  3 mL Intravenous Once     Allergies:  Allergies  Allergen Reactions  . Codeine Nausea Only  . Zinc Oxide Rash    Social History   Socioeconomic History  . Marital status: Married    Spouse name: Not on file  . Number of children: Not on file  . Years of education: Not on file  . Highest education level: Not on file  Occupational History  . Not on file  Social Needs  . Financial resource strain: Not on file  . Food insecurity:    Worry: Not on file    Inability: Not on file  . Transportation needs:    Medical: Not on file    Non-medical: Not on file  Tobacco Use  . Smoking status: Never Smoker  . Smokeless tobacco: Never Used  Substance and Sexual Activity  . Alcohol use: No  . Drug use: No  . Sexual activity: Not on file  Lifestyle  . Physical activity:    Days per week: Not on file    Minutes per session: Not on file  . Stress: Not on file  Relationships  . Social connections:    Talks on phone: Not on file    Gets together: Not on file    Attends religious service: Not on file    Active member of club or organization: Not on file    Attends meetings of clubs or organizations: Not on file    Relationship status: Not on file  . Intimate partner violence:    Fear of current or ex partner: Not on file    Emotionally abused: Not on file    Physically abused: Not on file    Forced sexual activity: Not on file  Other Topics Concern  . Not on file  Social History Narrative  . Not on file     Family History  Problem Relation Age of Onset  . Breast cancer Mother 25  . Lung cancer Father   . Prostate cancer Maternal Grandfather   . Breast cancer Paternal Aunt   . Breast cancer Cousin        paternal side     Review of Systems Positive for numbness Negative for: General:  chills, fever, night sweats or weight  changes.  Cardiovascular: PND orthopnea syncope dizziness  Dermatological skin lesions rashes Respiratory: Cough congestion Urologic: Frequent urination urination at night and hematuria Abdominal: negative for nausea, vomiting, diarrhea, bright red blood per rectum, melena, or hematemesis Neurologic: negative for visual changes, and/or hearing changes  All other systems reviewed and are otherwise negative except as noted above.  Labs: Recent Labs    07/01/18 2103 07/02/18 0235 07/02/18  0934  TROPONINI <0.03 <0.03 <0.03   Lab Results  Component Value Date   WBC 9.9 07/01/2018   HGB 11.9 (L) 07/01/2018   HCT 37.9 07/01/2018   MCV 86.7 07/01/2018   PLT 351 07/01/2018    Recent Labs  Lab 07/01/18 1256  NA 139  K 3.8  CL 104  CO2 28  BUN 17  CREATININE 0.57  CALCIUM 8.8*  PROT 6.7  BILITOT 0.5  ALKPHOS 51  ALT 27  AST 23  GLUCOSE 104*   Lab Results  Component Value Date   CHOL 201 (H) 07/02/2018   HDL 47 07/02/2018   LDLCALC 92 07/02/2018   TRIG 310 (H) 07/02/2018   No results found for: DDIMER  Radiology/Studies:  Ct Head Wo Contrast  Result Date: 07/01/2018 CLINICAL DATA:  Patient with altered mental status. Migraine headaches. EXAM: CT HEAD WITHOUT CONTRAST TECHNIQUE: Contiguous axial images were obtained from the base of the skull through the vertex without intravenous contrast. COMPARISON:  None. FINDINGS: Brain: No evidence of acute infarction, hemorrhage, hydrocephalus, extra-axial collection or mass lesion/mass effect. Ventricles and sulci are appropriate for patient's age. Vascular: No hyperdense vessel or unexpected calcification. Skull: Intact Sinuses/Orbits: Paranasal sinuses are well aerated. Mastoid air cells are unremarkable. Other: None IMPRESSION: No acute intracranial process. Electronically Signed   By: Lovey Newcomer M.D.   On: 07/01/2018 13:32   Mr Brain Wo Contrast  Result Date: 07/01/2018 CLINICAL DATA:  68 year old female with right side  numbness and confusion. EXAM: MRI HEAD WITHOUT CONTRAST TECHNIQUE: Multiplanar, multiecho pulse sequences of the brain and surrounding structures were obtained without intravenous contrast. COMPARISON:  Head CT earlier today. FINDINGS: Brain: There is small area of cortical restricted diffusion in the posterior left parietal lobe on series 2, image 37. There is suggestion of faint abnormal diffusion in the posterior left frontal lobe on image 39. No other restricted diffusion identified. Faint associated T2 and FLAIR hyperintensity at both areas (series 12, images 18 and 19). No mass effect. No acute intracranial hemorrhage identified. Chronic microhemorrhage suspected in the anterior left frontal lobe on series 10, image 34. No midline shift, mass effect, evidence of mass lesion, ventriculomegaly, extra-axial collection. Cervicomedullary junction and pituitary are within normal limits. No cortical encephalomalacia identified. Small chronic lacunar type infarct of the left cerebellum suspected on series 7, image 8. Possible similar chronic right side lacune on image 6. Deep gray matter nuclei and brainstem within normal limits. Vascular: Major intracranial vascular flow voids are preserved. Skull and upper cervical spine: Negative for age visible cervical spine. Normal bone marrow signal. Sinuses/Orbits: Negative. Other: Mastoids are clear. Scalp and face soft tissues appear negative. IMPRESSION: 1. Small acute infarcts in the posterior left MCA territory. No associated hemorrhage or mass effect. 2. No other acute intracranial abnormality. Possible small chronic infarcts in the cerebellum, chronic microhemorrhage in the left inferior frontal gyrus. Electronically Signed   By: Genevie Ann M.D.   On: 07/01/2018 18:45   US Carotid Bilateral (at [facility] And [facility] Only)  Result Date: 07/01/2018 CLINICAL DATA:  Transient ischemic attack. EXAM: BILATERAL CAROTID DUPLEX ULTRASOUND TECHNIQUE: Pearline Cables scale imaging, color Doppler and  duplex ultrasound were performed of bilateral carotid and vertebral arteries in the neck. COMPARISON:  None. FINDINGS: Criteria: Quantification of carotid stenosis is based on velocity parameters that correlate the residual internal carotid diameter with NASCET-based stenosis levels, using the diameter of the distal internal carotid lumen as the denominator for stenosis measurement. The following velocity measurements were  obtained: RIGHT ICA: 88 cm/sec CCA: 161 cm/sec SYSTOLIC ICA/CCA RATIO:  0.7 ECA: 124 cm/sec LEFT ICA: 75 cm/sec CCA: 80 cm/sec SYSTOLIC ICA/CCA RATIO:  0.9 ECA: 95 cm/sec RIGHT CAROTID ARTERY: Minimal intimal thickening/plaque formation in the carotid bulb and proximal internal carotid artery. RIGHT VERTEBRAL ARTERY:  Normal antegrade flow. LEFT CAROTID ARTERY: Minimal intimal thickening/plaque formation in the carotid bulb. LEFT VERTEBRAL ARTERY:  Normal antegrade flow. IMPRESSION: Minimal bilateral carotid artery intimal thickening/plaque formation without luminal stenosis or flow reduction. Electronically Signed   By: Claudie Revering M.D.   On: 07/01/2018 17:02    EKG: Normal sinus rhythm otherwise normal EKG  Weights: Filed Weights   07/01/18 1253  Weight: 111.1 kg     Physical Exam: Blood pressure (!) 143/83, pulse 84, temperature 97.9 F (36.6 C), temperature source Oral, resp. rate 18, height 5\' 2"  (1.575 m), weight 111.1 kg, SpO2 96 %. Body mass index is 44.81 kg/m. General: Well developed, well nourished, in no acute distress. Head eyes ears nose throat: Normocephalic, atraumatic, sclera non-icteric, no xanthomas, nares are without discharge. No apparent thyromegaly and/or mass  Lungs: Normal respiratory effort.  no wheezes, no rales, no rhonchi.  Heart: RRR with normal S1 S2. no murmur gallop, no rub, PMI is normal size and placement, carotid upstroke normal without bruit, jugular venous pressure is normal Abdomen: Soft, non-tender, non-distended with normoactive bowel  sounds. No hepatomegaly. No rebound/guarding. No obvious abdominal masses. Abdominal aorta is normal size without bruit Extremities: No edema. no cyanosis, no clubbing, no ulcers  Peripheral : 2+ bilateral upper extremity pulses, 2+ bilateral femoral pulses, 2+ bilateral dorsal pedal pulse Neuro: Alert and oriented. No facial asymmetry. No focal deficit. Moves all extremities spontaneously. Musculoskeletal: Normal muscle tone without kyphosis Psych:  Responds to questions appropriately with a normal affect.    Assessment: 68 year old female with TIA stroke with hypertension hyperlipidemia diabetes and paroxysmal nonvalvular atrial fibrillation needing further treatment  Plan: 1.  Continue beta-blocker but increased dose to 25 mg each day for better heart rate control of atrial fibrillation 2.  Hypertension control with losartan hydrochlorothiazide for goal systolic blood pressure below 130 mm 3.  Anticoagulation for further risk reduction in stroke with atrial fibrillation with Eliquis 5 mg twice per day 4.  Okay for aspirin initially depending on neurology consultation but otherwise would discontinue aspirin in Likins of higher bleeding risk with dual therapy 5.  Echocardiogram pending 6.  High intensity cholesterol therapy for further risk reduction cardiovascular event with diabetes 7.  Begin ambulation and follow-up for improvements of symptoms and if no further symptoms it is okay for discharged home from cardiac standpoint with follow-up next week for further adjustments of medication  Signed, Corey Skains M.D. [facility] 07/02/2018, 12:12 PM

## 2018-07-02 NOTE — Evaluation (Signed)
Occupational Therapy Evaluation Patient Details Name: Jane Moran MRN: [id number] DOB: [birth year] Today's Date: 07/02/2018    History of Present Illness Jane Moran is a 68 y.o. female who presented to the ED complaining of right sided numbness and acute confusion. Pt stated while getting ready she could not figure out how to put on her bra or work her phone. MRI + for "small acute infarcts in the posterior left MCA territory". PMH includes: arthritis, gastric reflux, migraines, & hypertension.   Clinical Impression   Pt seen for OT evaluation this date. Prior to hospital admission, pt was independent in all aspects of ADL/IADL and denies falls history in past 12 months. Pt endorses working, driving, and being a Hydrographic surveyor. Occasionally using a SPC when her R knee gets painful. Pt lives with her spouse and adult son in a 1 story home with 3-4 steps to enter with L hand rail. Currently, pt reporting symptoms have resolved. Pt demonstrates baseline independence to perform ADL and mobility tasks and no strength, sensory, coordination, cognitive, or visual deficits appreciated with assessment. No skilled OT needs identified. Will sign off. Please re-consult if additional OT needs arise.     Follow Up Recommendations  No OT follow up    Equipment Recommendations  None recommended by OT    Recommendations for Other Services       Precautions / Restrictions Precautions Precautions: Fall Restrictions Weight Bearing Restrictions: No      Mobility Bed Mobility Overal bed mobility: Independent                Transfers Overall transfer level: Independent Equipment used: None             General transfer comment: Pt able to complete all bed mobility and functional transfers independently without need for AD. Pt able to ambulate within room without difficulty.     Balance Overall balance assessment: No apparent balance deficits (not formally assessed)                                          ADL either performed or assessed with clinical judgement   ADL Overall ADL's : Independent                                       General ADL Comments: Pt back to baseline function for ADL tasks. Able to complete toilet transfer, toilet clothing mgt, hand hygiene, and grooming independently at time of evaluation. Moving well endorsing symptoms have resolved.      Vision Baseline Vision/History: Wears glasses Wears Glasses: At all times Patient Visual Report: No change from baseline       Perception     Praxis      Pertinent Vitals/Pain Pain Assessment: No/denies pain     Hand Dominance Right   Extremity/Trunk Assessment Upper Extremity Assessment Upper Extremity Assessment: Overall WFL for tasks assessed   Lower Extremity Assessment Lower Extremity Assessment: Overall WFL for tasks assessed   Cervical / Trunk Assessment Cervical / Trunk Assessment: Normal   Communication Communication Communication: No difficulties   Cognition Arousal/Alertness: Awake/alert Behavior During Therapy: WFL for tasks assessed/performed Overall Cognitive Status: Within Functional Limits for tasks assessed  General Comments: Pt A&O x4; alert; in good spirits at time of evaluation. Endorses symptoms have resolved.    General Comments  Pt endorses using SPC on occasion when pain in knees increases. Otherwise is independent in all areas of ADL/IADL. Endorses return to baseline level of sensation in the right side. No difficulty with motor planning noted throughout evaluation.    Exercises     Shoulder Instructions      Home Living Family/patient expects to be discharged to:: Private residence Living Arrangements: Spouse/significant other;Children Available Help at Discharge: Family;Neighbor;Available 24 hours/day Type of Home: House Home Access: Stairs to enter [employer] of Steps: 3-4(Pt has plans to install ramp. ) Entrance Stairs-Rails: Left Home Layout: One level     Bathroom Shower/Tub: Tub/shower unit;Curtain   Biochemist, clinical: Standard     Home Equipment: Shower seat;Cane - single point   Additional Comments: SPC available but pt not currently using.       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         OT Problem List: Impaired UE functional use;Decreased knowledge of use of DME or AE;Decreased coordination;Impaired sensation      OT Treatment/Interventions:      OT Goals(Current goals can be found in the care plan section) Acute Rehab OT Goals Patient Stated Goal: To go home OT Goal Formulation: All assessment and education complete, DC therapy Time For Goal Achievement: 07/02/18 Potential to Achieve Goals: Good  OT Frequency:     Barriers to D/C:            Co-evaluation              AM-PAC OT "6 Clicks" Daily Activity     Outcome Measure Help from another person eating meals?: None Help from another person taking care of personal grooming?: None Help from another person toileting, which includes using toliet, bedpan, or urinal?: None Help from another person bathing (including washing, rinsing, drying)?: None Help from another person to put on and taking off regular upper body clothing?: None Help from another person to put on and taking off regular lower body clothing?: None 6 Click Score: 24   End of Session Equipment Utilized During Treatment: Gait belt  Activity Tolerance: Patient tolerated treatment well Patient left: in bed;Other (comment)(With PT present to begin session. )  OT Visit Diagnosis: History of falling (Z91.81);Other symptoms and signs involving the nervous system (R29.898);Hemiplegia and hemiparesis Hemiplegia - Right/Left: Right Hemiplegia -  dominant/non-dominant: Dominant Hemiplegia - caused by: Cerebral infarction                Time: 0076-2263 OT Time Calculation (min): 25 min Charges:  OT General Charges $OT Visit: 1 Visit OT Evaluation $OT Eval Low Complexity: 1 Low OT Treatments $Self Care/Home Management : 8-22 mins  Shara Blazing, M.S., OTR/L Ascom: [phone] 07/02/18, 10:27 AM

## 2018-07-02 NOTE — Progress Notes (Signed)
SLP Cancellation Note  Patient Details Name: Jane Moran MRN: [id number] DOB: 05/19/1950   Cancelled treatment:       Reason Eval/Treat Not Completed: SLP screened, no needs identified, will sign off(chart reviewed; consulted NSG then met w/ pt/family). Pt denied any difficulty swallowing and is currently on a regular diet; tolerates swallowing pills w/ water per NSG. Pt conversed at conversational level w/out deficits noted; pt and family denied any speech-language deficits.  No further skilled ST services indicated as pt appears at her baseline. Pt agreed. NSG to reconsult if any change in status while admitted.     Orinda Kenner, MS, CCC-SLP Watson,Katherine 07/02/2018, 11:33 AM

## 2018-07-02 NOTE — Progress Notes (Signed)
*  PRELIMINARY RESULTS* Echocardiogram 2D Echocardiogram has been performed.  Jane Moran 07/02/2018, 9:31 AM

## 2018-07-02 NOTE — Progress Notes (Signed)
This Probation officer received a call from telemetry tech of pt's HR up to 150's A.fib with RVR for few sec, then HR back to 90's.  Pt currently in the bed and asymptomatic. Denies chest pain.  Per pt she was in the bathroom.  Dr Posey Pronto made aware of the above.  No new orders at this time.

## 2018-07-02 NOTE — Progress Notes (Signed)
[place] [facility] [place] NAME: Jane Moran    MR#:  [id number]  DATE OF BIRTH:  03-18-1951  SUBJECTIVE:   Patient came in with confusion and numbness yesterday. She is doing well no more symptoms. REVIEW OF SYSTEMS:   Review of Systems  Constitutional: Negative for chills, fever and weight loss.  HENT: Negative for ear discharge, ear pain and nosebleeds.   Eyes: Negative for blurred vision, pain and discharge.  Respiratory: Negative for sputum production, shortness of breath, wheezing and stridor.   Cardiovascular: Negative for chest pain, palpitations, orthopnea and PND.  Gastrointestinal: Negative for abdominal pain, diarrhea, nausea and vomiting.  Genitourinary: Negative for frequency and urgency.  Musculoskeletal: Negative for back pain and joint pain.  Neurological: Negative for sensory change, speech change, focal weakness and weakness.  Psychiatric/Behavioral: Negative for depression and hallucinations. The patient is not nervous/anxious.    Tolerating Diet:yes Tolerating PT: no PT needs  DRUG ALLERGIES:   Allergies  Allergen Reactions  . Codeine Nausea Only  . Zinc Oxide Rash    VITALS:  Blood pressure (!) 143/83, pulse 84, temperature 97.9 F (36.6 C), temperature source Oral, resp. rate 18, height 5\' 2"  (1.575 m), weight 111.1 kg, SpO2 96 %.  PHYSICAL EXAMINATION:   Physical Exam  GENERAL:  68 y.o.-year-old patient lying in the bed with no acute distress. obese EYES: Pupils equal, round, reactive to Macias and accommodation. No scleral icterus. Extraocular muscles intact.  HEENT: Head atraumatic, normocephalic. Oropharynx and nasopharynx clear.  NECK:  Supple, no jugular venous distention. No thyroid enlargement, no tenderness.  LUNGS: Normal breath sounds bilaterally, no wheezing, rales, rhonchi. No use of accessory muscles of respiration.  CARDIOVASCULAR: S1, S2 normal. No murmurs, rubs, or gallops.  ABDOMEN:  Soft, nontender, nondistended. Bowel sounds present. No organomegaly or mass.  EXTREMITIES: No cyanosis, clubbing or edema b/l.    NEUROLOGIC: Cranial nerves II through XII are intact. No focal Motor or sensory deficits b/l.   PSYCHIATRIC:  patient is alert and oriented x 3.  SKIN: No obvious rash, lesion, or ulcer.   LABORATORY PANEL:  CBC Recent Labs  Lab 07/01/18 1256  WBC 9.9  HGB 11.9*  HCT 37.9  PLT 351    Chemistries  Recent Labs  Lab 07/01/18 1256  NA 139  K 3.8  CL 104  CO2 28  GLUCOSE 104*  BUN 17  CREATININE 0.57  CALCIUM 8.8*  AST 23  ALT 27  ALKPHOS 51  BILITOT 0.5   Cardiac Enzymes Recent Labs  Lab 07/02/18 0934  TROPONINI <0.03   RADIOLOGY:  Ct Head Wo Contrast  Result Date: 07/01/2018 CLINICAL DATA:  Patient with altered mental status. Migraine headaches. EXAM: CT HEAD WITHOUT CONTRAST TECHNIQUE: Contiguous axial images were obtained from the base of the skull through the vertex without intravenous contrast. COMPARISON:  None. FINDINGS: Brain: No evidence of acute infarction, hemorrhage, hydrocephalus, extra-axial collection or mass lesion/mass effect. Ventricles and sulci are appropriate for patient's age. Vascular: No hyperdense vessel or unexpected calcification. Skull: Intact Sinuses/Orbits: Paranasal sinuses are well aerated. Mastoid air cells are unremarkable. Other: None IMPRESSION: No acute intracranial process. Electronically Signed   By: Lovey Newcomer M.D.   On: 07/01/2018 13:32   Mr Brain Wo Contrast  Result Date: 07/01/2018 CLINICAL DATA:  68 year old female with right side numbness and confusion. EXAM: MRI HEAD WITHOUT CONTRAST TECHNIQUE: Multiplanar, multiecho pulse sequences of the brain and surrounding structures were obtained without intravenous contrast.  COMPARISON:  Head CT earlier today. FINDINGS: Brain: There is small area of cortical restricted diffusion in the posterior left parietal lobe on series 2, image 37. There is suggestion of  faint abnormal diffusion in the posterior left frontal lobe on image 39. No other restricted diffusion identified. Faint associated T2 and FLAIR hyperintensity at both areas (series 12, images 18 and 19). No mass effect. No acute intracranial hemorrhage identified. Chronic microhemorrhage suspected in the anterior left frontal lobe on series 10, image 34. No midline shift, mass effect, evidence of mass lesion, ventriculomegaly, extra-axial collection. Cervicomedullary junction and pituitary are within normal limits. No cortical encephalomalacia identified. Small chronic lacunar type infarct of the left cerebellum suspected on series 7, image 8. Possible similar chronic right side lacune on image 6. Deep gray matter nuclei and brainstem within normal limits. Vascular: Major intracranial vascular flow voids are preserved. Skull and upper cervical spine: Negative for age visible cervical spine. Normal bone marrow signal. Sinuses/Orbits: Negative. Other: Mastoids are clear. Scalp and face soft tissues appear negative. IMPRESSION: 1. Small acute infarcts in the posterior left MCA territory. No associated hemorrhage or mass effect. 2. No other acute intracranial abnormality. Possible small chronic infarcts in the cerebellum, chronic microhemorrhage in the left inferior frontal gyrus. Electronically Signed   By: Genevie Ann M.D.   On: 07/01/2018 18:45   US Carotid Bilateral (at [facility] And [facility] Only)  Result Date: 07/01/2018 CLINICAL DATA:  Transient ischemic attack. EXAM: BILATERAL CAROTID DUPLEX ULTRASOUND TECHNIQUE: Pearline Cables scale imaging, color Doppler and duplex ultrasound were performed of bilateral carotid and vertebral arteries in the neck. COMPARISON:  None. FINDINGS: Criteria: Quantification of carotid stenosis is based on velocity parameters that correlate the residual internal carotid diameter with NASCET-based stenosis levels, using the diameter of the distal internal carotid lumen as the denominator for stenosis  measurement. The following velocity measurements were obtained: RIGHT ICA: 88 cm/sec CCA: 366 cm/sec SYSTOLIC ICA/CCA RATIO:  0.7 ECA: 124 cm/sec LEFT ICA: 75 cm/sec CCA: 80 cm/sec SYSTOLIC ICA/CCA RATIO:  0.9 ECA: 95 cm/sec RIGHT CAROTID ARTERY: Minimal intimal thickening/plaque formation in the carotid bulb and proximal internal carotid artery. RIGHT VERTEBRAL ARTERY:  Normal antegrade flow. LEFT CAROTID ARTERY: Minimal intimal thickening/plaque formation in the carotid bulb. LEFT VERTEBRAL ARTERY:  Normal antegrade flow. IMPRESSION: Minimal bilateral carotid artery intimal thickening/plaque formation without luminal stenosis or flow reduction. Electronically Signed   By: Claudie Revering M.D.   On: 07/01/2018 17:02   ASSESSMENT AND PLAN:   Sadeen Wiegel  is a 69 y.o. female with a known history of neuritis, hypertension, GERD migraines comes in because of right-sided facial numbness, transient confusion  1. Acute left posterior MCA territory CVA -continue aspirin -start eliquis from tomorrow per Dr. Irish Elders -MRI positive for stroke -echo of the heart noted -patient ambulated well. She is asymptomatic. Neuro- exam essentially negative -no PT or OT needs  2. A fib-- paroxysmal -risk factor obesity questionable obstructive sleep apnea, hypertension -continue metoprolol -Dr. Nehemiah Massed agrees with eliquis -patient will follow-up with cardiology as outpatient  3. Hyperlipidemia continue statins  4. DVT prophylaxis subcu Lovenox   Case discussed with Care Management/Social Worker. Management plans discussed with the patient, family and they are in agreement.  CODE STATUS: full  DVT Prophylaxis: lovenox TOTAL TIME TAKING CARE OF THIS PATIENT: *30* minutes.  >50% time spent on counselling and coordination of care  POSSIBLE D/C IN *1-2* DAYS, DEPENDING ON CLINICAL CONDITION.  Note: This dictation was prepared with Dragon dictation  along with smaller phrase technology. Any transcriptional  errors that result from this process are unintentional.  Fritzi Mandes M.D on 07/02/2018 at 1:44 PM  Between 7am to 6pm - Pager - [phone]  After 6pm go to [email or web address] - password [email or web address] [facility]  Office  [phone]  CC: Primary care physician; Adin Hector, MDPatient ID: Jane Moran, female   DOB: 09/09/1950, 68 y.o.   MRN: [id number]

## 2018-07-03 LAB — HIV ANTIBODY (ROUTINE TESTING W REFLEX): HIV SCREEN 4TH GENERATION: NONREACTIVE

## 2018-07-03 LAB — GLUCOSE, CAPILLARY: Glucose-Capillary: 91 mg/dL (ref 70–99)

## 2018-07-03 MED ORDER — ATORVASTATIN CALCIUM 20 MG PO TABS: 20.0000 mg | ORAL_TABLET | Freq: Every day | ORAL | 1 refills | Status: AC

## 2018-07-03 MED ORDER — APIXABAN 5 MG PO TABS: 5.0000 mg | ORAL_TABLET | Freq: Two times a day (BID) | ORAL | 2 refills | Status: AC

## 2018-07-03 MED ORDER — APIXABAN 5 MG PO TABS
5.0000 mg | ORAL_TABLET | Freq: Two times a day (BID) | ORAL | Status: DC
  Administered 2018-07-03: 10:00:00 5 mg via ORAL
  Filled 2018-07-03: qty 1

## 2018-07-03 MED ORDER — METOPROLOL TARTRATE 25 MG PO TABS: 25.0000 mg | ORAL_TABLET | Freq: Two times a day (BID) | ORAL | 1 refills | Status: DC

## 2018-07-03 NOTE — Progress Notes (Signed)
[facility] Cardiology [facility] Encounter Note  Patient: Jane Moran / Admit Date: 07/01/2018 / Date of Encounter: 07/03/2018, 8:36 AM   Subjective: Feels well this morning.  No evidence of palpitations overnight.  No evidence of new stroke or TIA type symptoms.  Telemetry shows normal sinus rhythm.  Patient is tolerating current medical regimen for risk reduction of atrial fibrillation and stroke  Review of Systems: Positive for: None Negative for: Vision change, hearing change, syncope, dizziness, nausea, vomiting,diarrhea, bloody stool, stomach pain, cough, congestion, diaphoresis, urinary frequency, urinary pain,skin lesions, skin rashes Others previously listed  Objective: Telemetry: Normal sinus rhythm Physical Exam: Blood pressure 126/69, pulse (!) 57, temperature (!) 97.5 F (36.4 C), temperature source Oral, resp. rate 18, height 5\' 2"  (1.575 m), weight 111.1 kg, SpO2 95 %. Body mass index is 44.81 kg/m. General: Well developed, well nourished, in no acute distress. Head: Normocephalic, atraumatic, sclera non-icteric, no xanthomas, nares are without discharge. Neck: No apparent masses Lungs: Normal respirations with no wheezes, no rhonchi, no rales , no crackles   Heart: Regular rate and rhythm, normal S1 S2, no murmur, no rub, no gallop, PMI is normal size and placement, carotid upstroke normal without bruit, jugular venous pressure normal Abdomen: Soft, non-tender, non-distended with normoactive bowel sounds. No hepatosplenomegaly. Abdominal aorta is normal size without bruit Extremities: No edema, no clubbing, no cyanosis, no ulcers,  Peripheral: 2+ radial, 2+ femoral, 2+ dorsal pedal pulses Neuro: Alert and oriented. Moves all extremities spontaneously. Psych:  Responds to questions appropriately with a normal affect.   Intake/Output Summary (Last 24 hours) at 07/03/2018 0836 Last data filed at 07/02/2018 1700 Gross per 24 hour  Intake 960 ml  Output -  Net 960 ml     Inpatient Medications:  . aspirin  300 mg Rectal Daily   Or  . aspirin  325 mg Oral Daily  . atorvastatin  20 mg Oral q1800  . cholecalciferol  2,000 Units Oral Daily  . enoxaparin (LOVENOX) injection  40 mg Subcutaneous Q12H  . losartan  50 mg Oral Daily   And  . hydrochlorothiazide  12.5 mg Oral Daily  . insulin aspart  0-9 Units Subcutaneous TID WC  . metFORMIN  500 mg Oral BID WC  . metoprolol tartrate  25 mg Oral BID  . omega-3 acid ethyl esters  2 g Oral Daily  . pantoprazole  40 mg Oral Daily  . sodium chloride flush  3 mL Intravenous Once   Infusions:   Labs: Recent Labs    07/01/18 1256  NA 139  K 3.8  CL 104  CO2 28  GLUCOSE 104*  BUN 17  CREATININE 0.57  CALCIUM 8.8*   Recent Labs    07/01/18 1256  AST 23  ALT 27  ALKPHOS 51  BILITOT 0.5  PROT 6.7  ALBUMIN 3.7   Recent Labs    07/01/18 1256  WBC 9.9  NEUTROABS 6.6  HGB 11.9*  HCT 37.9  MCV 86.7  PLT 351   Recent Labs    07/01/18 2103 07/02/18 0235 07/02/18 0934  TROPONINI <0.03 <0.03 <0.03   Invalid input(s): POCBNP Recent Labs    07/02/18 0235  HGBA1C 6.3*     Weights: Filed Weights   07/01/18 1253  Weight: 111.1 kg     Radiology/Studies:  Ct Head Wo Contrast  Result Date: 07/01/2018 CLINICAL DATA:  Patient with altered mental status. Migraine headaches. EXAM: CT HEAD WITHOUT CONTRAST TECHNIQUE: Contiguous axial images were obtained from the base  of the skull through the vertex without intravenous contrast. COMPARISON:  None. FINDINGS: Brain: No evidence of acute infarction, hemorrhage, hydrocephalus, extra-axial collection or mass lesion/mass effect. Ventricles and sulci are appropriate for patient's age. Vascular: No hyperdense vessel or unexpected calcification. Skull: Intact Sinuses/Orbits: Paranasal sinuses are well aerated. Mastoid air cells are unremarkable. Other: None IMPRESSION: No acute intracranial process. Electronically Signed   By: Lovey Newcomer M.D.   On:  07/01/2018 13:32   Mr Brain Wo Contrast  Result Date: 07/01/2018 CLINICAL DATA:  68 year old female with right side numbness and confusion. EXAM: MRI HEAD WITHOUT CONTRAST TECHNIQUE: Multiplanar, multiecho pulse sequences of the brain and surrounding structures were obtained without intravenous contrast. COMPARISON:  Head CT earlier today. FINDINGS: Brain: There is small area of cortical restricted diffusion in the posterior left parietal lobe on series 2, image 37. There is suggestion of faint abnormal diffusion in the posterior left frontal lobe on image 39. No other restricted diffusion identified. Faint associated T2 and FLAIR hyperintensity at both areas (series 12, images 18 and 19). No mass effect. No acute intracranial hemorrhage identified. Chronic microhemorrhage suspected in the anterior left frontal lobe on series 10, image 34. No midline shift, mass effect, evidence of mass lesion, ventriculomegaly, extra-axial collection. Cervicomedullary junction and pituitary are within normal limits. No cortical encephalomalacia identified. Small chronic lacunar type infarct of the left cerebellum suspected on series 7, image 8. Possible similar chronic right side lacune on image 6. Deep gray matter nuclei and brainstem within normal limits. Vascular: Major intracranial vascular flow voids are preserved. Skull and upper cervical spine: Negative for age visible cervical spine. Normal bone marrow signal. Sinuses/Orbits: Negative. Other: Mastoids are clear. Scalp and face soft tissues appear negative. IMPRESSION: 1. Small acute infarcts in the posterior left MCA territory. No associated hemorrhage or mass effect. 2. No other acute intracranial abnormality. Possible small chronic infarcts in the cerebellum, chronic microhemorrhage in the left inferior frontal gyrus. Electronically Signed   By: Genevie Ann M.D.   On: 07/01/2018 18:45   US Carotid Bilateral (at [facility] And Ap Only)  Result Date: 07/01/2018 CLINICAL DATA:   Transient ischemic attack. EXAM: BILATERAL CAROTID DUPLEX ULTRASOUND TECHNIQUE: Pearline Cables scale imaging, color Doppler and duplex ultrasound were performed of bilateral carotid and vertebral arteries in the neck. COMPARISON:  None. FINDINGS: Criteria: Quantification of carotid stenosis is based on velocity parameters that correlate the residual internal carotid diameter with NASCET-based stenosis levels, using the diameter of the distal internal carotid lumen as the denominator for stenosis measurement. The following velocity measurements were obtained: RIGHT ICA: 88 cm/sec CCA: 220 cm/sec SYSTOLIC ICA/CCA RATIO:  0.7 ECA: 124 cm/sec LEFT ICA: 75 cm/sec CCA: 80 cm/sec SYSTOLIC ICA/CCA RATIO:  0.9 ECA: 95 cm/sec RIGHT CAROTID ARTERY: Minimal intimal thickening/plaque formation in the carotid bulb and proximal internal carotid artery. RIGHT VERTEBRAL ARTERY:  Normal antegrade flow. LEFT CAROTID ARTERY: Minimal intimal thickening/plaque formation in the carotid bulb. LEFT VERTEBRAL ARTERY:  Normal antegrade flow. IMPRESSION: Minimal bilateral carotid artery intimal thickening/plaque formation without luminal stenosis or flow reduction. Electronically Signed   By: Claudie Revering M.D.   On: 07/01/2018 17:02     Assessment and Recommendation  68 y.o. female with essential hypertension mixed hyperlipidemia diabetes with acute stroke and paroxysmal nonvalvular atrial fibrillation.  No current evidence of significant residual concerns from stroke. 1.  Continue medication management metoprolol at 25 mg for heart rate control and maintenance of normal sinus rhythm 2.  Continue anticoagulation for further  risk reduction in stroke with atrial fibrillation 3.  Hypertension control with losartan 4.  High intensity cholesterol therapy for further risk reduction in cardiovascular disease with known vascular disease as well as diabetes 5.  Begin ambulation today and follow for improvements of symptoms and okay for discharge home  from cardiac standpoint with follow-up in 1 to 2 weeks for further adjustments of medication  Signed, Serafina Royals M.D. FACC

## 2018-07-03 NOTE — Progress Notes (Signed)
Information on my medicine - ELIQUIS (apixaban)  This medication education was reviewed with me or my healthcare representative as part of my discharge preparation.  The pharmacist that spoke with me during my hospital stay was:  Gianna Calef, [facility]  Why was Eliquis prescribed for you? Eliquis was prescribed for you to reduce the risk of forming blood clots that can cause a stroke if you have a medical condition called atrial fibrillation (a type of irregular heartbeat) OR to reduce the risk of a blood clots forming after orthopedic surgery.  What do You need to know about Eliquis ? Take your Eliquis TWICE DAILY - one tablet in the morning and one tablet in the evening with or without food.  It would be best to take the doses about the same time each day.  If you have difficulty swallowing the tablet whole please discuss with your pharmacist how to take the medication safely.  Take Eliquis exactly as prescribed by your doctor and DO NOT stop taking Eliquis without talking to the doctor who prescribed the medication.  Stopping may increase your risk of developing a new clot or stroke.  Refill your prescription before you run out.  After discharge, you should have regular check-up appointments with your healthcare provider that is prescribing your Eliquis.  In the future your dose may need to be changed if your kidney function or weight changes by a significant amount or as you get older.  What do you do if you miss a dose? If you miss a dose, take it as soon as you remember on the same day and resume taking twice daily.  Do not take more than one dose of ELIQUIS at the same time.  Important Safety Information A possible side effect of Eliquis is bleeding. You should call your healthcare provider right away if you experience any of the following: ? Bleeding from an injury or your nose that does not stop. ? Unusual colored urine (red or dark brown) or unusual colored stools (red or  black). ? Unusual bruising for unknown reasons. ? A serious fall or if you hit your head (even if there is no bleeding).  Some medicines may interact with Eliquis and might increase your risk of bleeding or clotting while on Eliquis. To help avoid this, consult your healthcare provider or pharmacist prior to using any new prescription or non-prescription medications, including herbals, vitamins, non-steroidal anti-inflammatory drugs (NSAIDs) and supplements.  This website has more information on Eliquis (apixaban): [email or web address].

## 2018-07-03 NOTE — Discharge Summary (Signed)
[facility] [place] NAME: Jane Moran    MR#:  [id number]  DATE OF BIRTH:  20-Aug-1950  DATE OF ADMISSION:  07/01/2018 ADMITTING PHYSICIAN: Jane Lesches, MD  DATE OF DISCHARGE: 07/03/2018  PRIMARY CARE PHYSICIAN: Jane Hector, MD    ADMISSION DIAGNOSIS:  TIA (transient ischemic attack) [G45.9]  DISCHARGE DIAGNOSIS:  Acute left posterior MCA territory CVA Paroxysmal Afib  SECONDARY DIAGNOSIS:   Past Medical History:  Diagnosis Date  . Arthritis   . Cancer ([place])    skin  . Diffuse cystic mastopathy   . Dysrhythmia   . Esophageal reflux   . Family history of malignant neoplasm of breast   . H/O cystitis   . H/O endoscopy 2012  . Headache    migranes  . Hypertension 1990  . Pre-diabetes   . Screening cholesterol level   . Screening for obesity   . Special screening for malignant neoplasms, colon     HOSPITAL COURSE:   [name] a68 y.o.femalewith a known history of neuritis, hypertension, GERD migraines comes in because of right-sided facial numbness, transient confusion  1. Acute left posterior MCA territory CVA -continue aspirin--now changed to po eliquis per Neuro ( Dr. Irish Moran) -MRI positive for stroke -echo of the heart noted -patient ambulated well. She is asymptomatic. Neuro- exam essentially negative -no PT or OT needs  2. A fib-- paroxysmal, new -risk factor obesity questionable obstructive sleep apnea, hypertension -continue metoprolol -Dr. Nehemiah Moran agrees with eliquis -patient will follow-up with cardiology as outpatient  3. Hyperlipidemia continue statins  4. DVT prophylaxis subcu Lovenox  5. Obesity with suspected sleep apnea--- will defer primary care physician to do workup for sleep apnea as outpatient  discharge plan discussed with patient. She is agreeable with it.  Patient is requesting leave note for two weeks since her work requires her to be out on  administrative leave since she was in the hospital and possibly exposed to coronoavirus.   CONSULTS OBTAINED:  Treatment Team:  Jane Skains, MD Jane Pain, MD  DRUG ALLERGIES:   Allergies  Allergen Reactions  . Codeine Nausea Only  . Zinc Oxide Rash    DISCHARGE MEDICATIONS:   Allergies as of 07/03/2018      Reactions   Codeine Nausea Only   Zinc Oxide Rash      Medication List    TAKE these medications   apixaban 5 MG Tabs tablet Commonly known as:  ELIQUIS Take 1 tablet (5 mg total) by mouth 2 (two) times daily.   atorvastatin 20 MG tablet Commonly known as:  LIPITOR Take 1 tablet (20 mg total) by mouth daily at 6 PM.   CALCIUM 600 PO Take 600 mg by mouth daily.   dexlansoprazole 60 MG capsule Commonly known as:  DEXILANT Take 60-120 mg by mouth daily.   losartan-hydrochlorothiazide 50-12.5 MG tablet Commonly known as:  HYZAAR Take 1 tablet by mouth daily.   metFORMIN 500 MG tablet Commonly known as:  GLUCOPHAGE Take 500 mg by mouth 2 (two) times daily with a meal.   metoprolol tartrate 25 MG tablet Commonly known as:  LOPRESSOR Take 1 tablet (25 mg total) by mouth 2 (two) times daily.   niacin 500 MG tablet Take 500 mg by mouth daily.   Omega 3 1000 MG Caps Take 2,000 mg by mouth daily.   Probiotic Daily Caps Take 1 capsule by mouth daily.   Vitamin D3 50 MCG (2000 UT) Tabs Take  4,000 Units by mouth daily.       If you experience worsening of your admission symptoms, develop shortness of breath, life threatening emergency, suicidal or homicidal thoughts you must seek medical attention immediately by calling 911 or calling your MD immediately  if symptoms less severe.  You Must read complete instructions/literature along with all the possible adverse reactions/side effects for all the Medicines you take and that have been prescribed to you. Take any new Medicines after you have completely understood and accept all the possible  adverse reactions/side effects.   Please note  You were cared for by a hospitalist during your hospital stay. If you have any questions about your discharge medications or the care you received while you were in the hospital after you are discharged, you can call the unit and asked to speak with the hospitalist on call if the hospitalist that took care of you is not available. Once you are discharged, your primary care physician will handle any further medical issues. Please note that NO REFILLS for any discharge medications will be authorized once you are discharged, as it is imperative that you return to your primary care physician (or establish a relationship with a primary care physician if you do not have one) for your aftercare needs so that they can reassess your need for medications and monitor your lab values. Today   SUBJECTIVE   No new complaints  VITAL SIGNS:  Blood pressure (!) 153/77, pulse 70, temperature 98.1 F (36.7 C), temperature source Oral, resp. rate 16, height 5\' 2"  (1.575 m), weight 111.1 kg, SpO2 96 %.  I/O:    Intake/Output Summary (Last 24 hours) at 07/03/2018 0938 Last data filed at 07/02/2018 1700 Gross per 24 hour  Intake 600 ml  Output -  Net 600 ml    PHYSICAL EXAMINATION:  GENERAL:  68 y.o.-year-old patient lying in the bed with no acute distress.  EYES: Pupils equal, round, reactive to Jane Moran and accommodation. No scleral icterus. Extraocular muscles intact.  HEENT: Head atraumatic, normocephalic. Oropharynx and nasopharynx clear.  NECK:  Supple, no jugular venous distention. No thyroid enlargement, no tenderness.  LUNGS: Normal breath sounds bilaterally, no wheezing, rales,rhonchi or crepitation. No use of accessory muscles of respiration.  CARDIOVASCULAR: S1, S2 normal. No murmurs, rubs, or gallops.  ABDOMEN: Soft, non-tender, non-distended. Bowel sounds present. No organomegaly or mass.  EXTREMITIES: No pedal edema, cyanosis, or clubbing.   NEUROLOGIC: Cranial nerves II through XII are intact. Muscle strength 5/5 in all extremities. Sensation intact. Gait not checked.  PSYCHIATRIC: The patient is alert and oriented x 3.  SKIN: No obvious rash, lesion, or ulcer.   DATA REVIEW:   CBC  Recent Labs  Lab 07/01/18 1256  WBC 9.9  HGB 11.9*  HCT 37.9  PLT 351    Chemistries  Recent Labs  Lab 07/01/18 1256  NA 139  K 3.8  CL 104  CO2 28  GLUCOSE 104*  BUN 17  CREATININE 0.57  CALCIUM 8.8*  AST 23  ALT 27  ALKPHOS 51  BILITOT 0.5    Microbiology Results   No results found for this or any previous visit (from the past 240 hour(s)).  RADIOLOGY:  Ct Head Wo Contrast  Result Date: 07/01/2018 CLINICAL DATA:  Patient with altered mental status. Migraine headaches. EXAM: CT HEAD WITHOUT CONTRAST TECHNIQUE: Contiguous axial images were obtained from the base of the skull through the vertex without intravenous contrast. COMPARISON:  None. FINDINGS: Brain: No evidence of acute  infarction, hemorrhage, hydrocephalus, extra-axial collection or mass lesion/mass effect. Ventricles and sulci are appropriate for patient's age. Vascular: No hyperdense vessel or unexpected calcification. Skull: Intact Sinuses/Orbits: Paranasal sinuses are well aerated. Mastoid air cells are unremarkable. Other: None IMPRESSION: No acute intracranial process. Electronically Signed   By: Lovey Newcomer M.D.   On: 07/01/2018 13:32   Mr Brain Wo Contrast  Result Date: 07/01/2018 CLINICAL DATA:  68 year old female with right side numbness and confusion. EXAM: MRI HEAD WITHOUT CONTRAST TECHNIQUE: Multiplanar, multiecho pulse sequences of the brain and surrounding structures were obtained without intravenous contrast. COMPARISON:  Head CT earlier today. FINDINGS: Brain: There is small area of cortical restricted diffusion in the posterior left parietal lobe on series 2, image 37. There is suggestion of faint abnormal diffusion in the posterior left frontal lobe  on image 39. No other restricted diffusion identified. Faint associated T2 and FLAIR hyperintensity at both areas (series 12, images 18 and 19). No mass effect. No acute intracranial hemorrhage identified. Chronic microhemorrhage suspected in the anterior left frontal lobe on series 10, image 34. No midline shift, mass effect, evidence of mass lesion, ventriculomegaly, extra-axial collection. Cervicomedullary junction and pituitary are within normal limits. No cortical encephalomalacia identified. Small chronic lacunar type infarct of the left cerebellum suspected on series 7, image 8. Possible similar chronic right side lacune on image 6. Deep gray matter nuclei and brainstem within normal limits. Vascular: Major intracranial vascular flow voids are preserved. Skull and upper cervical spine: Negative for age visible cervical spine. Normal bone marrow signal. Sinuses/Orbits: Negative. Other: Mastoids are clear. Scalp and face soft tissues appear negative. IMPRESSION: 1. Small acute infarcts in the posterior left MCA territory. No associated hemorrhage or mass effect. 2. No other acute intracranial abnormality. Possible small chronic infarcts in the cerebellum, chronic microhemorrhage in the left inferior frontal gyrus. Electronically Signed   By: Genevie Ann M.D.   On: 07/01/2018 18:45   US Carotid Bilateral (at [facility] Only)  Result Date: 07/01/2018 CLINICAL DATA:  Transient ischemic attack. EXAM: BILATERAL CAROTID DUPLEX ULTRASOUND TECHNIQUE: Pearline Cables scale imaging, color Doppler and duplex ultrasound were performed of bilateral carotid and vertebral arteries in the neck. COMPARISON:  None. FINDINGS: Criteria: Quantification of carotid stenosis is based on velocity parameters that correlate the residual internal carotid diameter with NASCET-based stenosis levels, using the diameter of the distal internal carotid lumen as the denominator for stenosis measurement. The following velocity measurements were obtained:  RIGHT ICA: 88 cm/sec CCA: 161 cm/sec SYSTOLIC ICA/CCA RATIO:  0.7 ECA: 124 cm/sec LEFT ICA: 75 cm/sec CCA: 80 cm/sec SYSTOLIC ICA/CCA RATIO:  0.9 ECA: 95 cm/sec RIGHT CAROTID ARTERY: Minimal intimal thickening/plaque formation in the carotid bulb and proximal internal carotid artery. RIGHT VERTEBRAL ARTERY:  Normal antegrade flow. LEFT CAROTID ARTERY: Minimal intimal thickening/plaque formation in the carotid bulb. LEFT VERTEBRAL ARTERY:  Normal antegrade flow. IMPRESSION: Minimal bilateral carotid artery intimal thickening/plaque formation without luminal stenosis or flow reduction. Electronically Signed   By: Claudie Revering M.D.   On: 07/01/2018 17:02     CODE STATUS:     Code Status Orders  (From admission, onward)         Start     Ordered   07/01/18 1556  Full code  Continuous     07/01/18 1558        Code Status History    This patient has a current code status but no historical code status.    Advance Directive Documentation  Most Recent Value  Type of Advance Directive  Healthcare Power of Attorney, Living will  Pre-existing out of facility DNR order (yellow form or pink MOST form)  -  "MOST" Form in Place?  -      TOTAL TIME TAKING CARE OF THIS PATIENT: *40* minutes.    Fritzi Mandes M.D on 07/03/2018 at 9:38 AM  Between 7am to 6pm - Pager - [phone] After 6pm go to [email or web address] - password [email or web address] [facility]  Office  [phone]  CC: Primary care physician; Jane Hector, MD

## 2018-07-03 NOTE — TOC Initial Note (Signed)
Transition of Care [facility]) - Initial/Assessment Note    Patient Details  Name: Jane Moran MRN: [id number] Date of Birth: May 13, 1950  [facility] CM/SW Contact:    Shelbie Ammons, RN Phone Number: 07/03/2018, 10:00 AM  Clinical Narrative: Admitted to [facility] with the diagnosis   Of small acute infarct. Lives with spouse. See Dr. Caryl Comes as primary care physician New onset of Atrial fibrillation. Seen by Cardiology. Eliquis free 30 day trial offer coupon given Family will transport.  Discharge to home today per Dr. Fritzi Mandes                Expected Discharge Plan: Home/Self Care Barriers to Discharge: No Barriers Identified   Patient Goals and CMS Choice Patient states their goals for this hospitalization and ongoing recovery are:: (wants to go home) CMS Medicare.gov Compare Post Acute Care list provided to:: (Not given, No home health needs) Choice offered to / list presented to : NA  Expected Discharge Plan and Services Expected Discharge Plan: Home/Self Care Discharge Planning Services: CM Consult Post Acute Care Choice: NA Living arrangements for the past 2 months: Single Family Home Expected Discharge Date: 07/03/18               DME Arranged: (None) DME Agency: NA HH Arranged: NA    Prior Living Arrangements/Services Living arrangements for the past 2 months: Single Family Home Lives with:: (Spouse) Patient language and need for interpreter reviewed:: No Do you feel safe going back to the place where you live?: Yes      Need for Family Participation in Patient Care: No (Comment) Care giver support system in place?: No (comment) Current home services: Other (comment)(None) Criminal Activity/Legal Involvement Pertinent to Current Situation/Hospitalization: No - Comment as needed  Activities of Daily Living Home Assistive Devices/Equipment: Eyeglasses ADL Screening (condition at time of admission) Patient's cognitive ability adequate to  safely complete daily activities?: Yes Is the patient deaf or have difficulty hearing?: No Does the patient have difficulty seeing, even when wearing glasses/contacts?: No Does the patient have difficulty concentrating, remembering, or making decisions?: No Patient able to express need for assistance with ADLs?: Yes Does the patient have difficulty dressing or bathing?: No Independently performs ADLs?: Yes (appropriate for developmental age) Does the patient have difficulty walking or climbing stairs?: No Weakness of Legs: None Weakness of Arms/Hands: None  Permission Sought/Granted Permission sought to share information with : Case Manager Permission granted to share information with : Yes, Verbal Permission Granted              Emotional Assessment Appearance:: Appears stated age Attitude/Demeanor/Rapport: Self-Confident([place]) Affect (typically observed): Appropriate Orientation: : Oriented to Self, Oriented to Place, Oriented to  Time, Oriented to Situation Alcohol / Substance Use: Never Used Psych Involvement: No (comment)  Admission diagnosis:  TIA (transient ischemic attack) [G45.9] Patient Active Problem List   Diagnosis Date Noted  . Acute CVA (cerebrovascular accident) ([place]) 07/02/2018  . TIA (transient ischemic attack) 07/01/2018  . GERD (gastroesophageal reflux disease) 07/26/2012  . 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 Family history of breast cancer 07/26/2012  . Hypertension    PCP:  Adin Hector, MD Pharmacy:   [facility] [address] [place] - [place] [address] Phone: [phone] Fax: [place], [place] - [place] [address] [place] [address] Phone: [phone] Fax: [phone]  [place],  [place] - [address] [place] [address] Phone: [phone] Fax: [phone]     Social Determinants of Health  (SDOH) Interventions    Readmission Risk Interventions 30 Day Unplanned Readmission Risk Score     ED to Hosp-Admission (Current) from 07/01/2018 in [place] (1C)  30 Day Unplanned Readmission Risk Score (%)  12 Filed at 07/03/2018 0801     This score is the patient's risk of an unplanned readmission within 30 days of being discharged (0 -100%). The score is based on dignosis, age, lab data, medications, orders, and past utilization.   Low:  0-14.9   Medium: 15-21.9   High: 22-29.9   Extreme: 30 and above       No flowsheet data found.

## 2018-10-08 IMAGING — MG MM DIGITAL SCREENING BILAT W/ TOMO W/ CAD
8 series · 8 of 24 positions shown · non-contrast
Comparison: Previous exam(s).

ACR Breast Density Category a: The breast tissue is almost entirely
fatty.

CLINICAL DATA: Screening.

EXAM:
DIGITAL SCREENING BILATERAL MAMMOGRAM WITH TOMO AND CAD

[L CC synth-2D]
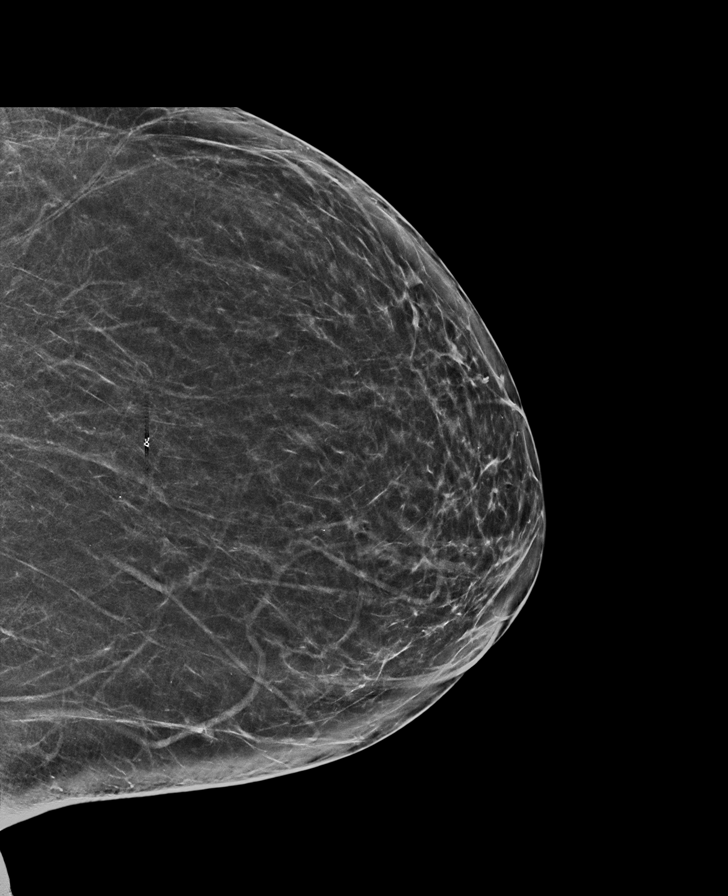

[R CC synth-2D]
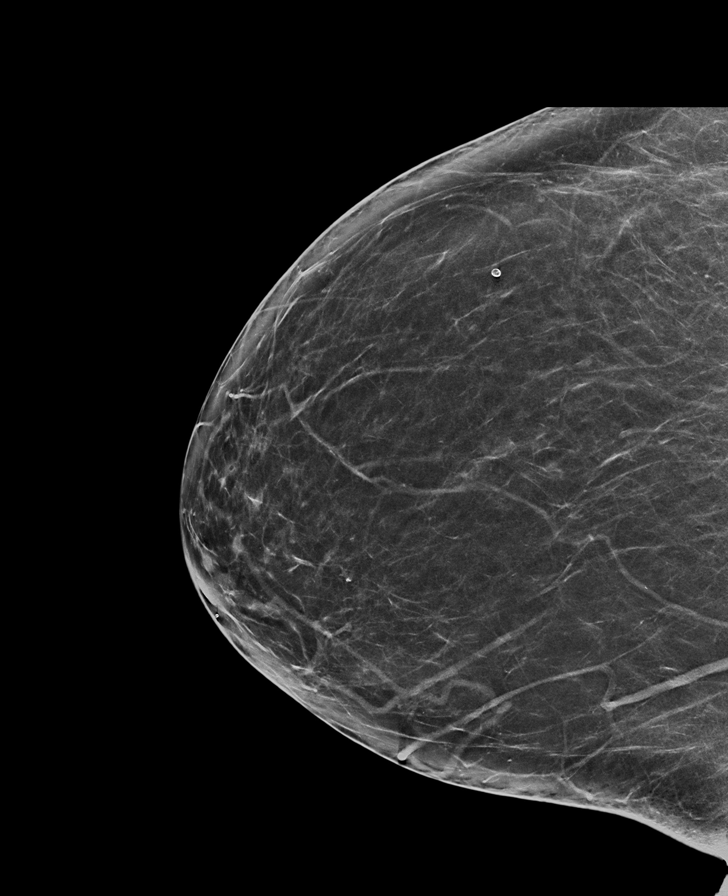

[R MLO synth-2D]
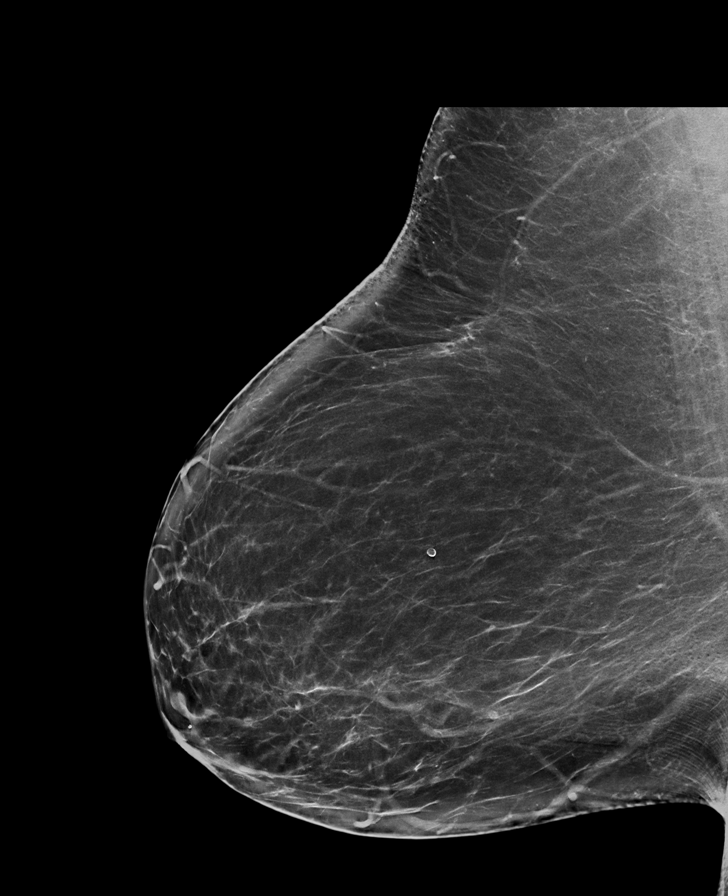

[L MLO synth-2D]
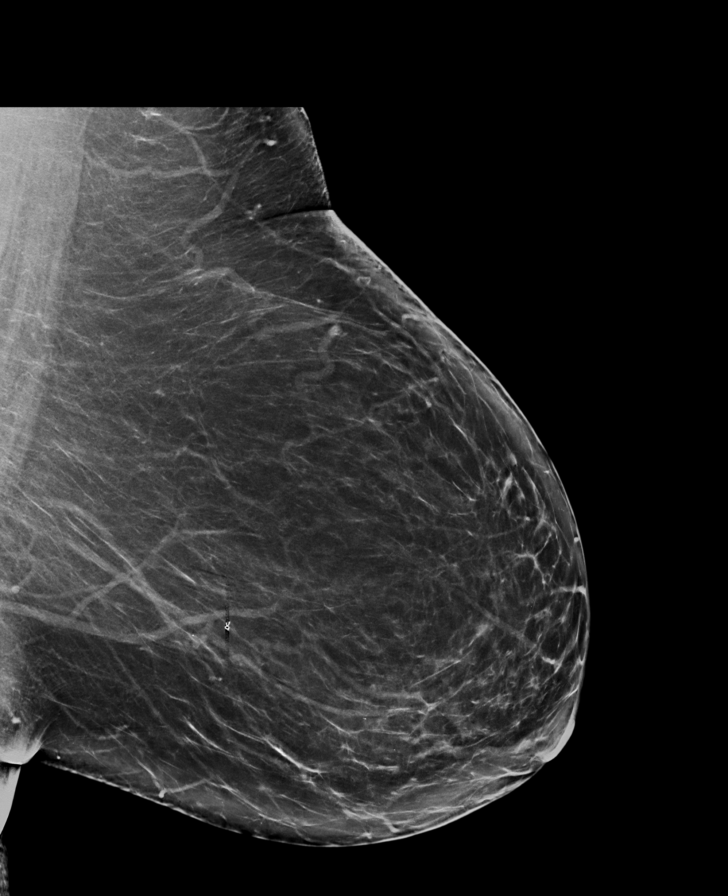

[L MLO tomo · tomo slice 45/90.0]
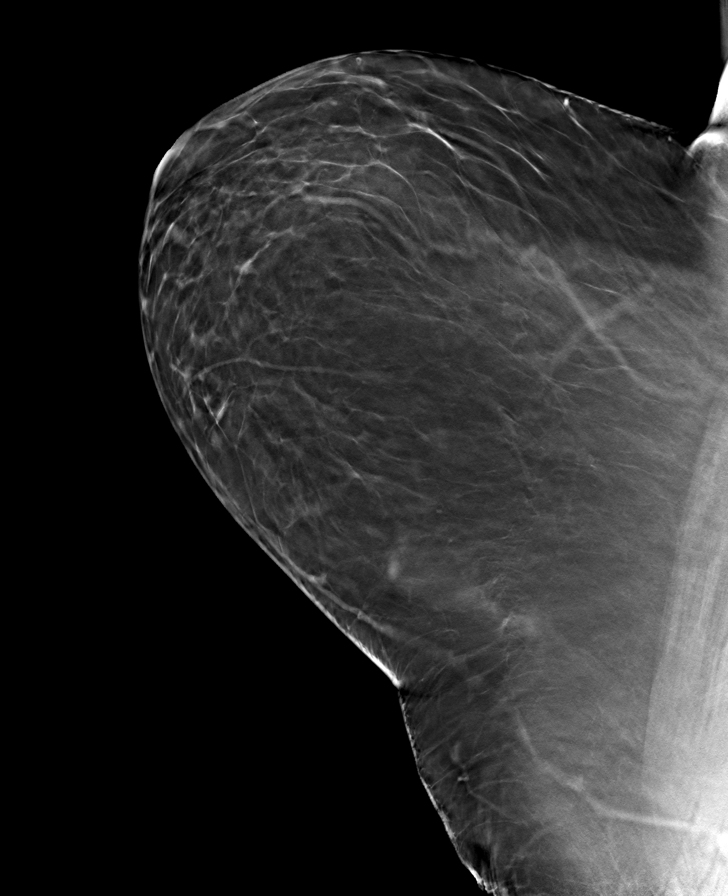

[L CC tomo · tomo slice 37/74.0]
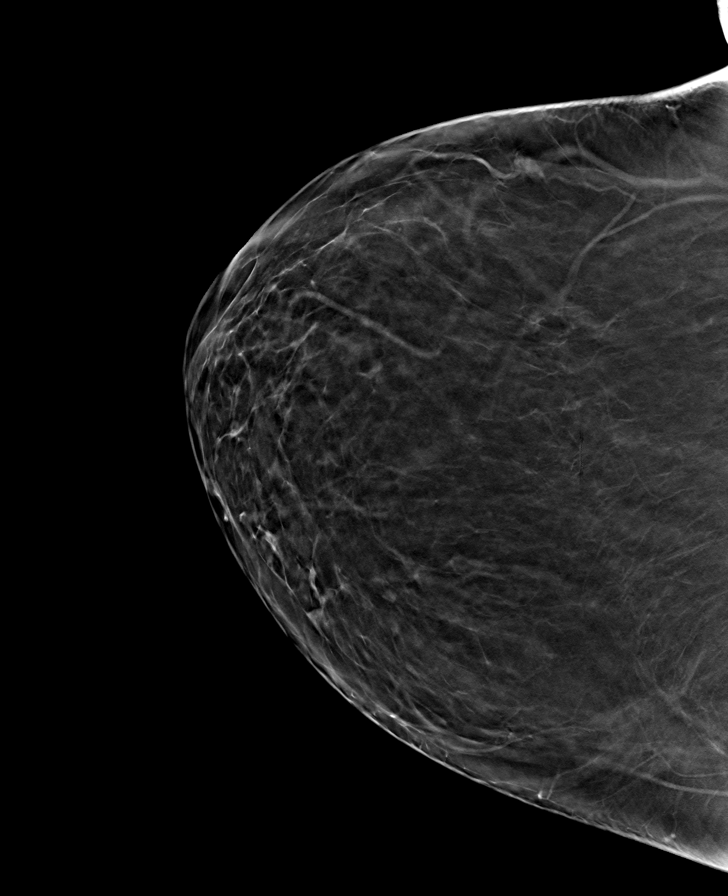

[R MLO tomo · tomo slice 46/91.0]
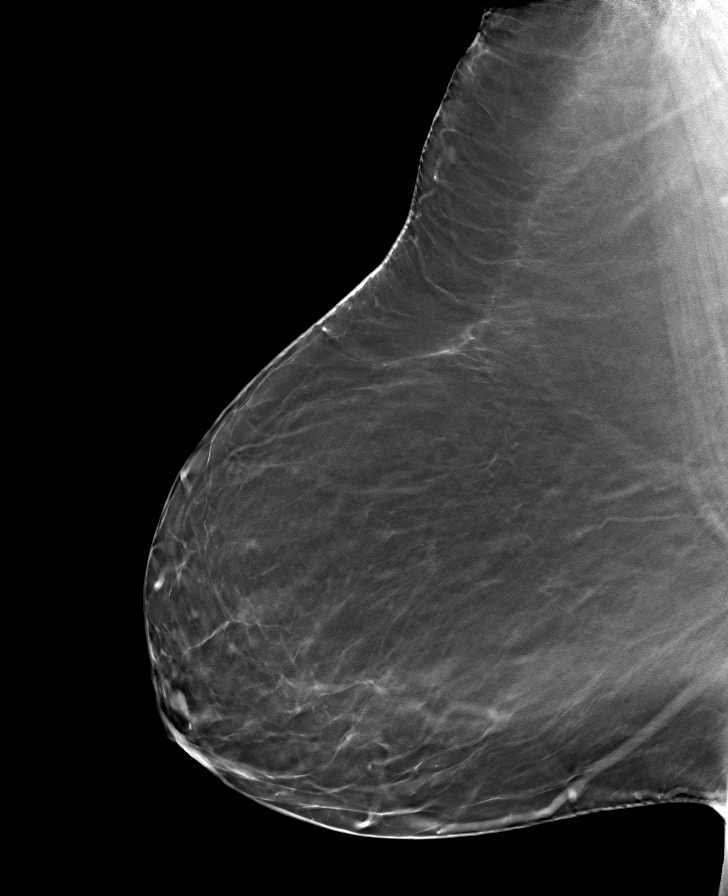

[R CC tomo · tomo slice 39/76.0]
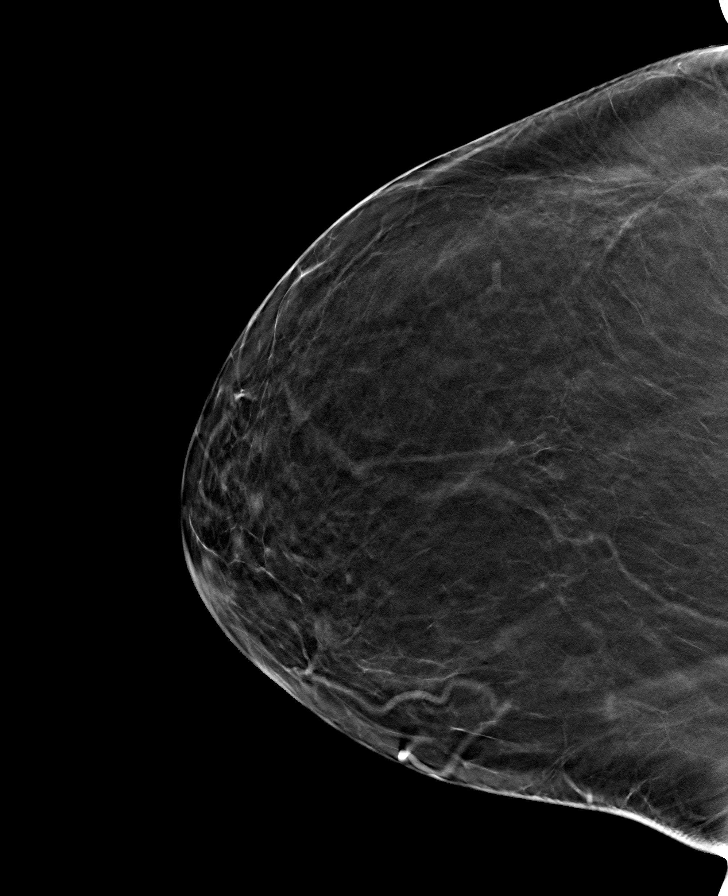

[8 of 24 positions shown; findings below may reference images not displayed]

FINDINGS: There are no findings suspicious for malignancy. Images were
processed with CAD.
IMPRESSION: No mammographic evidence of malignancy. A result letter of this
screening mammogram will be mailed directly to the patient.

RECOMMENDATION:
Screening mammogram in one year. (Code:[id number])

BI-RADS CATEGORY  1: Negative.

## 2019-02-11 ENCOUNTER — Other Ambulatory Visit: Payer: Self-pay | Admitting: Internal Medicine

## 2019-02-11 DIAGNOSIS — Z1231 Encounter for screening mammogram for malignant neoplasm of breast: Secondary | ICD-10-CM

## 2019-05-01 ENCOUNTER — Ambulatory Visit
Admission: RE | Admit: 2019-05-01 | Discharge: 2019-05-01 | Disposition: A | Discharge status: Home / Self Care | Payer: Medicare Other | Source: Ambulatory Visit | Attending: Internal Medicine | Admitting: Internal Medicine

## 2019-05-01 DIAGNOSIS — Z1231 Encounter for screening mammogram for malignant neoplasm of breast: Secondary | ICD-10-CM | POA: Diagnosis present

## 2020-03-30 ENCOUNTER — Other Ambulatory Visit
Admission: RE | Admit: 2020-03-30 | Discharge: 2020-03-30 | Disposition: A | Discharge status: Home / Self Care | Payer: Medicare Other | Source: Ambulatory Visit | Attending: Internal Medicine | Admitting: Internal Medicine

## 2020-03-30 NOTE — Progress Notes (Signed)
Came to covid testing site; was covid positive on 01/16/2020. Verified positive result in care everywhere [facility]. Patient has not tested after that. No need to repeat test within 90 days per protocol. Patient informed and acknowledged understanding

## 2020-03-31 ENCOUNTER — Encounter: Payer: Self-pay | Admitting: Internal Medicine

## 2020-04-01 ENCOUNTER — Encounter: Payer: Self-pay | Admitting: Internal Medicine

## 2020-04-01 ENCOUNTER — Other Ambulatory Visit: Payer: Self-pay

## 2020-04-01 ENCOUNTER — Ambulatory Visit: Payer: Medicare Other | Admitting: Anesthesiology

## 2020-04-01 ENCOUNTER — Encounter
Admission: RE | Disposition: A | Discharge status: Home / Self Care | Payer: Self-pay | Source: Home / Self Care | Attending: Internal Medicine

## 2020-04-01 ENCOUNTER — Ambulatory Visit
Admission: RE | Admit: 2020-04-01 | Discharge: 2020-04-01 | Disposition: A | Discharge status: Home / Self Care | Payer: Medicare Other | Attending: Internal Medicine | Admitting: Internal Medicine

## 2020-04-01 DIAGNOSIS — K640 First degree hemorrhoids: Secondary | ICD-10-CM | POA: Insufficient documentation

## 2020-04-01 DIAGNOSIS — Z7984 Long term (current) use of oral hypoglycemic drugs: Secondary | ICD-10-CM | POA: Diagnosis not present

## 2020-04-01 DIAGNOSIS — K317 Polyp of stomach and duodenum: Secondary | ICD-10-CM | POA: Insufficient documentation

## 2020-04-01 DIAGNOSIS — D125 Benign neoplasm of sigmoid colon: Secondary | ICD-10-CM | POA: Diagnosis not present

## 2020-04-01 DIAGNOSIS — Z79899 Other long term (current) drug therapy: Secondary | ICD-10-CM | POA: Diagnosis not present

## 2020-04-01 DIAGNOSIS — K449 Diaphragmatic hernia without obstruction or gangrene: Secondary | ICD-10-CM | POA: Insufficient documentation

## 2020-04-01 DIAGNOSIS — Z888 Allergy status to other drugs, medicaments and biological substances status: Secondary | ICD-10-CM | POA: Diagnosis not present

## 2020-04-01 DIAGNOSIS — K219 Gastro-esophageal reflux disease without esophagitis: Secondary | ICD-10-CM | POA: Diagnosis present

## 2020-04-01 DIAGNOSIS — Z7901 Long term (current) use of anticoagulants: Secondary | ICD-10-CM | POA: Insufficient documentation

## 2020-04-01 DIAGNOSIS — Z885 Allergy status to narcotic agent status: Secondary | ICD-10-CM | POA: Insufficient documentation

## 2020-04-01 DIAGNOSIS — D509 Iron deficiency anemia, unspecified: Secondary | ICD-10-CM | POA: Diagnosis present

## 2020-04-01 DIAGNOSIS — K5730 Diverticulosis of large intestine without perforation or abscess without bleeding: Secondary | ICD-10-CM | POA: Insufficient documentation

## 2020-04-01 DIAGNOSIS — Z7982 Long term (current) use of aspirin: Secondary | ICD-10-CM | POA: Insufficient documentation

## 2020-04-01 HISTORY — DX: Other complications of anesthesia, initial encounter: T88.59XA

## 2020-04-01 HISTORY — DX: Sleep apnea, unspecified: G47.30

## 2020-04-01 HISTORY — DX: Nausea with vomiting, unspecified: R11.2

## 2020-04-01 HISTORY — DX: Dermatitis, unspecified: L30.9

## 2020-04-01 HISTORY — PX: ESOPHAGOGASTRODUODENOSCOPY (EGD) WITH PROPOFOL: SHX5813

## 2020-04-01 HISTORY — DX: Unspecified lump in unspecified breast: N63.0

## 2020-04-01 HISTORY — DX: Paroxysmal atrial fibrillation: I48.0

## 2020-04-01 HISTORY — DX: Unspecified hemorrhoids: K64.9

## 2020-04-01 HISTORY — DX: Benign neoplasm of colon, unspecified: D12.6

## 2020-04-01 HISTORY — DX: Migraine, unspecified, not intractable, without status migrainosus: G43.909

## 2020-04-01 HISTORY — DX: Other specified postprocedural states: Z98.890

## 2020-04-01 HISTORY — DX: Cerebral infarction, unspecified: I63.9

## 2020-04-01 HISTORY — DX: Hyperlipidemia, unspecified: E78.5

## 2020-04-01 HISTORY — PX: COLONOSCOPY WITH PROPOFOL: SHX5780

## 2020-04-01 HISTORY — DX: Vitamin D deficiency, unspecified: E55.9

## 2020-04-01 HISTORY — DX: Type 2 diabetes mellitus without complications: E11.9

## 2020-04-01 HISTORY — DX: Hyperglycemia, unspecified: R73.9

## 2020-04-01 SURGERY — COLONOSCOPY WITH PROPOFOL
Anesthesia: General

## 2020-04-01 MED ORDER — ONDANSETRON HCL 4 MG/2ML IJ SOLN
INTRAMUSCULAR | Status: DC | PRN
  Administered 2020-04-01: 4 mg via INTRAVENOUS

## 2020-04-01 MED ORDER — ONDANSETRON HCL 4 MG/2ML IJ SOLN
INTRAMUSCULAR | Status: AC
  Filled 2020-04-01: qty 2

## 2020-04-01 MED ORDER — PROPOFOL 10 MG/ML IV BOLUS
INTRAVENOUS | Status: DC | PRN
  Administered 2020-04-01 (×5): 20 mg via INTRAVENOUS

## 2020-04-01 MED ORDER — MIDAZOLAM HCL 2 MG/2ML IJ SOLN
INTRAMUSCULAR | Status: AC
  Filled 2020-04-01: qty 2

## 2020-04-01 MED ORDER — FENTANYL CITRATE (PF) 100 MCG/2ML IJ SOLN
INTRAMUSCULAR | Status: DC | PRN
  Administered 2020-04-01 (×4): 25 ug via INTRAVENOUS

## 2020-04-01 MED ORDER — PROPOFOL 500 MG/50ML IV EMUL
INTRAVENOUS | Status: DC | PRN
  Administered 2020-04-01: 50 ug/kg/min via INTRAVENOUS

## 2020-04-01 MED ORDER — FENTANYL CITRATE (PF) 100 MCG/2ML IJ SOLN
INTRAMUSCULAR | Status: AC
  Filled 2020-04-01: qty 2

## 2020-04-01 MED ORDER — SODIUM CHLORIDE 0.9 % IV SOLN
INTRAVENOUS | Status: DC
  Administered 2020-04-01: 08:00:00 1000 mL via INTRAVENOUS

## 2020-04-01 MED ORDER — MIDAZOLAM HCL 5 MG/5ML IJ SOLN
INTRAMUSCULAR | Status: DC | PRN
  Administered 2020-04-01 (×2): 1 mg via INTRAVENOUS

## 2020-04-01 MED ORDER — PROPOFOL 500 MG/50ML IV EMUL
INTRAVENOUS | Status: AC
  Filled 2020-04-01: qty 50

## 2020-04-01 MED ORDER — LIDOCAINE HCL (PF) 2 % IJ SOLN
INTRAMUSCULAR | Status: DC | PRN
  Administered 2020-04-01: 50 mg

## 2020-04-01 NOTE — Interval H&P Note (Signed)
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  The various methods of treatment have been discussed with the patient and family. After consideration of risks, benefits and other options for treatment, the patient has consented to  Procedure(s) with comments: COLONOSCOPY WITH PROPOFOL (N/A) - COVID POSITIVE ON 9/30 AT [facility] ESOPHAGOGASTRODUODENOSCOPY (EGD) WITH PROPOFOL (N/A) as a surgical intervention.  The patient's history has been reviewed, patient examined, no change in status, stable for surgery.  I have reviewed the patient's chart and labs.  Questions were answered to the patient's satisfaction.     [place], [place]

## 2020-04-01 NOTE — Anesthesia Preprocedure Evaluation (Addendum)
Anesthesia Evaluation  Patient identified by MRN, date of birth, ID band Patient awake    Reviewed: Allergy & Precautions, H&P , NPO status , Patient's Chart, lab work & pertinent test results, reviewed documented beta blocker date and time   History of Anesthesia Complications (+) PONV and history of anesthetic complications  Airway Mallampati: II   Neck ROM: full    Dental  (+) Poor Dentition   Pulmonary neg pulmonary ROS, sleep apnea ,    Pulmonary exam normal        Cardiovascular Exercise Tolerance: Poor hypertension, On Medications negative cardio ROS Normal cardiovascular exam+ dysrhythmias  Rhythm:regular Rate:Normal     Neuro/Psych  Headaches, TIACVA negative neurological ROS  negative psych ROS   GI/Hepatic negative GI ROS, Neg liver ROS, GERD  Medicated,  Endo/Other  negative endocrine ROSdiabetes  Renal/GU negative Renal ROS  negative genitourinary   Musculoskeletal   Abdominal   Peds  Hematology negative hematology ROS (+)   Anesthesia Other Findings Past Medical History: No date: Arthritis No date: Benign breast lumps No date: Cancer ([place])     Comment:  skin No date: CVA (cerebral vascular accident) ([place]) No date: Diabetes mellitus without complication ([place]) No date: Diffuse cystic mastopathy No date: Dysrhythmia No date: Eczema No date: Esophageal reflux No date: Family history of malignant neoplasm of breast No date: H/O cystitis 2012: H/O endoscopy No date: Headache     Comment:  migranes No date: Hemorrhoid No date: Hyperglycemia No date: Hyperlipemia 1990: Hypertension No date: Migraines No date: Paroxysmal A-fib ([place]) No date: Pre-diabetes No date: Screening cholesterol level No date: Screening for obesity No date: Sleep apnea No date: Special screening for malignant neoplasms, colon No date: Tubular adenoma of colon No date: Vitamin D deficiency Past Surgical  History: 2011: BREAST BIOPSY; Left     Comment:  benign biopsy with Dr. Jamal Collin No date: BREAST BIOPSY No date: BREAST SURGERY     Comment:  breast biopsy 1972: BUNIONECTOMY No date: BUNIONECTOMY 2007: COLONOSCOPY 11/13/2014: COLONOSCOPY WITH PROPOFOL; N/A     Comment:  Procedure: COLONOSCOPY WITH PROPOFOL;  Surgeon: Josefine Class, MD;  Location: [facility] ENDOSCOPY;  Service:               Endoscopy;  Laterality: N/A; 03/05/2018: COLONOSCOPY WITH PROPOFOL; N/A     Comment:  Procedure: COLONOSCOPY WITH PROPOFOL;  Surgeon: Manya Silvas, MD;  Location: [facility] ENDOSCOPY;  Service:               Endoscopy;  Laterality: N/A; [id number],4174: CYST REMOVAL NECK No date: CYSTECTOMY 12/16/2016: ESOPHAGOGASTRODUODENOSCOPY (EGD) WITH PROPOFOL; N/A     Comment:  Procedure: ESOPHAGOGASTRODUODENOSCOPY (EGD) WITH               PROPOFOL;  Surgeon: Manya Silvas, MD;  Location:               [facility] ENDOSCOPY;  Service: Endoscopy;  Laterality: N/A; No date: FOOT SURGERY No date: KNEE ARTHROSCOPY 2011: KNEE SURGERY; Left No date: LIPOMA EXCISION     Comment:  bvack of neck  No date: WISDOM TOOTH EXTRACTION   Reproductive/Obstetrics negative OB ROS                            Anesthesia Physical Anesthesia Plan  ASA:  III  Anesthesia Plan: General   Post-op Pain Management:    Induction:   PONV Risk Score and Plan:   Airway Management Planned:   Additional Equipment:   Intra-op Plan:   Post-operative Plan:   Informed Consent: I have reviewed the patients History and Physical, chart, labs and discussed the procedure including the risks, benefits and alternatives for the proposed anesthesia with the patient or authorized representative who has indicated his/her understanding and acceptance.     Dental Advisory Given  Plan Discussed with: CRNA  Anesthesia Plan Comments:         Anesthesia Quick Evaluation

## 2020-04-01 NOTE — Op Note (Signed)
[facility] Gastroenterology Patient Name: Jane Moran Procedure Date: 04/01/2020 8:59 AM MRN: [id number] Account #: [id number] Date of Birth: [birth year] Admit Type: Outpatient Age: 69 Room: [facility] ENDO ROOM 2 Gender: Female Note Status: Finalized Procedure:             Colonoscopy Indications:           Unexplained iron deficiency anemia Providers:             Benay Pike. Alice Reichert MD, MD Referring MD:          Ramonita Lab, MD (Referring MD) Medicines:             Propofol per Anesthesia Complications:         No immediate complications. Procedure:             Pre-Anesthesia Assessment:                        - The risks and benefits of the procedure and the                         sedation options and risks were discussed with the                         patient. All questions were answered and informed                         consent was obtained.                        - Patient identification and proposed procedure were                         verified prior to the procedure by the nurse. The                         procedure was verified in the procedure room.                        - ASA Grade Assessment: II - A patient with mild                         systemic disease.                        - After reviewing the risks and benefits, the patient                         was deemed in satisfactory condition to undergo the                         procedure.                        After obtaining informed consent, the colonoscope was                         passed under direct vision. Throughout the procedure,                         the patient's blood pressure, pulse, and  oxygen                         saturations were monitored continuously. The                         Colonoscope was introduced through the anus and                         advanced to the the cecum, identified by appendiceal                         orifice and ileocecal valve. The colonoscopy  was                         somewhat difficult due to significant looping.                         Successful completion of the procedure was aided by                         applying abdominal pressure. The patient tolerated the                         procedure well. The quality of the bowel preparation                         was good. The ileocecal valve, appendiceal orifice,                         and rectum were photographed. Findings:      The perianal and digital rectal examinations were normal. Pertinent       negatives include normal sphincter tone and no palpable rectal lesions.      A few small-mouthed diverticula were found in the sigmoid colon.      A 6 mm polyp was found in the sigmoid colon. The polyp was sessile. The       polyp was removed with a jumbo cold forceps. Resection and retrieval       were complete.      Internal hemorrhoids were found during retroflexion. The hemorrhoids       were Grade I (internal hemorrhoids that do not prolapse).      The exam was otherwise without abnormality. Impression:            - Diverticulosis in the sigmoid colon.                        - One 6 mm polyp in the sigmoid colon, removed with a                         jumbo cold forceps. Resected and retrieved.                        - Internal hemorrhoids.                        - The examination was otherwise normal. Recommendation:        - Patient has a contact number available for  emergencies. The signs and symptoms of potential                         delayed complications were discussed with the patient.                         Return to normal activities tomorrow. Written                         discharge instructions were provided to the patient.                        - Await pathology results from EGD, also performed                         today.                        - Resume previous diet.                        - Continue present  medications.                        - Await pathology results.                        - Repeat colonoscopy after studies are complete for                         surveillance.                        - To visualize the small bowel, perform video capsule                         endoscopy at appointment to be scheduled.                        - Return to physician assistant in 2 months.                        - The findings and recommendations were discussed with                         the patient. Procedure Code(s):     --- Professional ---                        [phone], Colonoscopy, flexible; with biopsy, single or                         multiple Diagnosis Code(s):     --- Professional ---                        K57.30, Diverticulosis of large intestine without                         perforation or abscess without bleeding                        D50.9, Iron deficiency  anemia, unspecified                        K63.5, Polyp of colon                        K64.0, First degree hemorrhoids CPT copyright 2019 American [employer] Association. All rights reserved. The codes documented in this report are preliminary and upon coder review may  be revised to meet current compliance requirements. Efrain Sella MD, MD 04/01/2020 9:52:01 AM This report has been signed electronically. Number of Addenda: 0 Note Initiated On: 04/01/2020 8:59 AM Scope Withdrawal Time: 0 hours 8 minutes 24 seconds  Total Procedure Duration: 0 hours 15 minutes 27 seconds  Estimated Blood Loss:  Estimated blood loss: none.      [facility]

## 2020-04-01 NOTE — H&P (Signed)
Outpatient short stay form Pre-procedure 04/01/2020 8:58 AM Jane Moran. Jane Moran, M.D.  Primary Physician: Ramonita Lab III, M.D.  Reason for visit:  Iron deficiency anemia  History of present illness:  Patient presents for diagnosis of progressive anemia and found to have iron deficiency. Has no complaints of upper symptoms such as anorexia, abdominal pain, severe GERD, dysphagia, hemetemesis, melena, nausea or vomiting. Patient denies change in bowel habits, rectal bleeding, weight loss or abdominal pain.      Current Facility-Administered Medications:  .  0.9 %  sodium chloride infusion, , Intravenous, Continuous, [place], Benay Pike, MD, Last Rate: 20 mL/hr at 04/01/20 0824, 1,000 mL at 04/01/20 7169  Medications Prior to Admission  Medication Sig Dispense Refill Last Dose  . aspirin-acetaminophen-caffeine (EXCEDRIN MIGRAINE) 250-250-65 MG tablet Take by mouth every 6 (six) hours as needed for headache.   Past Month at Unknown time  . atorvastatin (LIPITOR) 20 MG tablet Take 1 tablet (20 mg total) by mouth daily at 6 PM. 30 tablet 1 03/31/2020 at Unknown time  . blood glucose meter kit and supplies by Other route as directed. Dispense based on patient and insurance preference. Use up to four times daily as directed. (FOR ICD-10 E10.9, E11.9).   03/31/2020 at Unknown time  . Calcium Carbonate (CALCIUM 600 PO) Take 600 mg by mouth daily.   03/31/2020 at Unknown time  . Cholecalciferol (VITAMIN D3) 50 MCG (2000 UT) TABS Take 4,000 Units by mouth daily.   03/31/2020 at Unknown time  . dexlansoprazole (DEXILANT) 60 MG capsule Take 60-120 mg by mouth daily.    03/31/2020 at Unknown time  . Glucose Blood (COOL BLOOD GLUCOSE TEST STRIPS VI) by In Vitro route.   03/31/2020 at Unknown time  . hydrochlorothiazide (HYDRODIURIL) 12.5 MG tablet Take 12.5 mg by mouth daily.   04/01/2020 at 0600  . LACTOBACILLUS ACID-PECTIN PO Take by mouth.   03/31/2020 at Unknown time  . losartan-hydrochlorothiazide  (HYZAAR) 50-12.5 MG tablet Take 1 tablet by mouth daily.   03/31/2020 at Unknown time  . metFORMIN (GLUCOPHAGE) 500 MG tablet Take 500 mg by mouth 2 (two) times daily with a meal.    03/31/2020 at Unknown time  . metoprolol tartrate (LOPRESSOR) 25 MG tablet Take 1 tablet (25 mg total) by mouth 2 (two) times daily. 60 tablet 1 04/01/2020 at 0600  . niacin 500 MG tablet Take 500 mg by mouth daily.   03/31/2020 at Unknown time  . Omega 3 1000 MG CAPS Take 2,000 mg by mouth daily.   03/31/2020 at Unknown time  . Probiotic Product (PROBIOTIC DAILY) CAPS Take 1 capsule by mouth daily.   03/31/2020 at Unknown time  . apixaban (ELIQUIS) 5 MG TABS tablet Take 1 tablet (5 mg total) by mouth 2 (two) times daily. 60 tablet 2 03/28/2020     Allergies  Allergen Reactions  . Codeine Nausea Only  . Zinc Oxide Rash     Past Medical History:  Diagnosis Date  . Arthritis   . Benign breast lumps   . Cancer ([place])    skin  . Complication of anesthesia   . CVA (cerebral vascular accident) ([place])   . Diabetes mellitus without complication ([place])   . Diffuse cystic mastopathy   . Dysrhythmia   . Eczema   . Esophageal reflux   . Family history of malignant neoplasm of breast   . H/O cystitis   . H/O endoscopy 2012  . Headache    migranes  . Hemorrhoid   .  Hyperglycemia   . Hyperlipemia   . Hypertension 1990  . Migraines   . Paroxysmal A-fib ([place])   . PONV (postoperative nausea and vomiting)   . Pre-diabetes   . Screening cholesterol level   . Screening for obesity   . Sleep apnea   . Special screening for malignant neoplasms, colon   . Tubular adenoma of colon   . Vitamin D deficiency     Review of systems:  Otherwise negative.    Physical Exam  Gen: Alert, oriented. Appears stated age.  HEENT: [place]/AT. PERRLA. Lungs: CTA, no wheezes. CV: RR nl S1, S2. Abd: soft, benign, no masses. BS+ Ext: No edema. Pulses 2+    Planned procedures: Proceed with EGD and colonoscopy. The patient  understands the nature of the planned procedure, indications, risks, alternatives and potential complications including but not limited to bleeding, infection, perforation, damage to internal organs and possible oversedation/side effects from anesthesia. The patient agrees and gives consent to proceed.  Please refer to procedure notes for findings, recommendations and patient disposition/instructions.     Kaitlin Alcindor Moran. Jane Moran, M.D. Gastroenterology 04/01/2020  8:58 AM

## 2020-04-01 NOTE — Op Note (Signed)
[facility] Gastroenterology Patient Name: Jane Moran Procedure Date: 04/01/2020 8:59 AM MRN: [id number] Account #: [id number] Date of Birth: 02-23-1951 Admit Type: Outpatient Age: 69 Room: [facility] ENDO ROOM 2 Gender: Female Note Status: Finalized Procedure:             Upper GI endoscopy Indications:           Unexplained iron deficiency anemia, Esophageal reflux Providers:             Benay Pike. Alice Reichert MD, MD Referring MD:          Ramonita Lab, MD (Referring MD) Medicines:             Propofol per Anesthesia Complications:         No immediate complications. Procedure:             Pre-Anesthesia Assessment:                        - The risks and benefits of the procedure and the                         sedation options and risks were discussed with the                         patient. All questions were answered and informed                         consent was obtained.                        - Patient identification and proposed procedure were                         verified prior to the procedure by the nurse. The                         procedure was verified in the procedure room.                        - ASA Grade Assessment: III - A patient with severe                         systemic disease.                        - After reviewing the risks and benefits, the patient                         was deemed in satisfactory condition to undergo the                         procedure.                        After obtaining informed consent, the endoscope was                         passed under direct vision. Throughout the procedure,                         the patient's  blood pressure, pulse, and oxygen                         saturations were monitored continuously. The Endoscope                         was introduced through the mouth, and advanced to the                         third part of duodenum. The upper GI endoscopy was                          accomplished without difficulty. The patient tolerated                         the procedure well. Findings:      The examined esophagus was normal.      A 4 cm hiatal hernia was present.      Multiple 4 to 15 mm pedunculated and sessile polyps with no bleeding and       no stigmata of recent bleeding were found in the gastric body. Biopsies       were taken with a cold forceps for histology.      A single large pedunculated polyp with no bleeding and stigmata of       recent bleeding was found in the gastric body. Biopsies were taken with       a cold forceps for histology.      The examined duodenum was normal.      Biopsies for histology were taken with a cold forceps in the first       portion of the duodenum and in the second portion of the duodenum for       evaluation of celiac disease.      The exam was otherwise without abnormality. Impression:            - Normal esophagus.                        - 4 cm hiatal hernia.                        - Multiple gastric polyps. Biopsied.                        - A single gastric polyp. Biopsied.                        - Normal examined duodenum.                        - The examination was otherwise normal.                        - Biopsies were taken with a cold forceps for                         evaluation of celiac disease. Recommendation:        - Await pathology results.                        - Proceed with colonoscopy Procedure  Code(s):     --- Professional ---                        [phone], Esophagogastroduodenoscopy, flexible,                         transoral; with biopsy, single or multiple Diagnosis Code(s):     --- Professional ---                        K21.9, Gastro-esophageal reflux disease without                         esophagitis                        D50.9, Iron deficiency anemia, unspecified                        K31.7, Polyp of stomach and duodenum                        K44.9, Diaphragmatic hernia without  obstruction or                         gangrene CPT copyright 2019 American Medical Association. All rights reserved. The codes documented in this report are preliminary and upon coder review may  be revised to meet current compliance requirements. Efrain Sella MD, MD 04/01/2020 9:29:20 AM This report has been signed electronically. Number of Addenda: 0 Note Initiated On: 04/01/2020 8:59 AM Estimated Blood Loss:  Estimated blood loss was minimal.      [facility]

## 2020-04-01 NOTE — Transfer of Care (Signed)
Immediate Anesthesia Transfer of Care Note  Patient: Jane Moran  Procedure(s) Performed: COLONOSCOPY WITH PROPOFOL (N/A ) ESOPHAGOGASTRODUODENOSCOPY (EGD) WITH PROPOFOL (N/A )  Patient Location: PACU  Anesthesia Type:General  Level of Consciousness: sedated  Airway & Oxygen Therapy: Patient Spontanous Breathing and Patient connected to nasal cannula oxygen  Post-op Assessment: Report given to RN and Post -op Vital signs reviewed and stable  Post vital signs: Reviewed and stable  Last Vitals:  Vitals Value Taken Time  BP    Temp    Pulse    Resp    SpO2      Last Pain:  Vitals:   04/01/20 0950  TempSrc: Temporal  PainSc:          Complications: No complications documented.

## 2020-04-02 ENCOUNTER — Encounter: Payer: Self-pay | Admitting: Internal Medicine

## 2020-04-02 LAB — SURGICAL PATHOLOGY

## 2020-04-13 NOTE — Anesthesia Postprocedure Evaluation (Signed)
Anesthesia Post Note  Patient: [name]  Procedure(s) Performed: COLONOSCOPY WITH PROPOFOL (N/A ) ESOPHAGOGASTRODUODENOSCOPY (EGD) WITH PROPOFOL (N/A )  Patient location during evaluation: PACU Anesthesia Type: General Level of consciousness: awake and alert Pain management: pain level controlled Vital Signs Assessment: post-procedure vital signs reviewed and stable Respiratory status: spontaneous breathing, nonlabored ventilation, respiratory function stable and patient connected to nasal cannula oxygen Cardiovascular status: blood pressure returned to baseline and stable Postop Assessment: no apparent nausea or vomiting Anesthetic complications: no   No complications documented.   Last Vitals:  Vitals:   04/01/20 1000 04/01/20 1010  BP: 123/66 117/75  Pulse: 71 71  Resp: (!) 25 (!) 24  Temp:    SpO2: 98% 98%    Last Pain:  Vitals:   04/02/20 0727  TempSrc:   PainSc: 0-No pain                 Jane Moran

## 2020-06-19 ENCOUNTER — Other Ambulatory Visit: Payer: Self-pay | Admitting: Internal Medicine

## 2020-06-19 DIAGNOSIS — Z1231 Encounter for screening mammogram for malignant neoplasm of breast: Secondary | ICD-10-CM

## 2020-07-09 ENCOUNTER — Other Ambulatory Visit: Payer: Self-pay

## 2020-07-09 ENCOUNTER — Ambulatory Visit
Admission: RE | Admit: 2020-07-09 | Discharge: 2020-07-09 | Disposition: A | Discharge status: Home / Self Care | Payer: Medicare Other | Source: Ambulatory Visit | Attending: Internal Medicine | Admitting: Internal Medicine

## 2020-07-09 DIAGNOSIS — Z1231 Encounter for screening mammogram for malignant neoplasm of breast: Secondary | ICD-10-CM | POA: Insufficient documentation

## 2021-08-20 ENCOUNTER — Other Ambulatory Visit: Payer: Self-pay | Admitting: Internal Medicine

## 2021-08-20 DIAGNOSIS — Z1231 Encounter for screening mammogram for malignant neoplasm of breast: Secondary | ICD-10-CM

## 2021-09-17 ENCOUNTER — Ambulatory Visit
Admission: RE | Admit: 2021-09-17 | Discharge: 2021-09-17 | Disposition: A | Discharge status: Home / Self Care | Payer: Medicare Other | Source: Ambulatory Visit | Attending: Internal Medicine | Admitting: Internal Medicine

## 2021-09-17 DIAGNOSIS — Z1231 Encounter for screening mammogram for malignant neoplasm of breast: Secondary | ICD-10-CM | POA: Diagnosis not present

## 2021-11-16 ENCOUNTER — Observation Stay
Admission: EM | Admit: 2021-11-16 | Discharge: 2021-11-17 | Disposition: A | Discharge status: Home / Self Care | Payer: Medicare Other | Attending: Internal Medicine | Admitting: Internal Medicine

## 2021-11-16 ENCOUNTER — Encounter: Payer: Self-pay | Admitting: Emergency Medicine

## 2021-11-16 DIAGNOSIS — Z79899 Other long term (current) drug therapy: Secondary | ICD-10-CM | POA: Diagnosis not present

## 2021-11-16 DIAGNOSIS — I480 Paroxysmal atrial fibrillation: Secondary | ICD-10-CM | POA: Diagnosis not present

## 2021-11-16 DIAGNOSIS — Z85828 Personal history of other malignant neoplasm of skin: Secondary | ICD-10-CM | POA: Diagnosis not present

## 2021-11-16 DIAGNOSIS — Z6841 Body Mass Index (BMI) 40.0 and over, adult: Secondary | ICD-10-CM | POA: Diagnosis not present

## 2021-11-16 DIAGNOSIS — I10 Essential (primary) hypertension: Secondary | ICD-10-CM | POA: Diagnosis not present

## 2021-11-16 DIAGNOSIS — K219 Gastro-esophageal reflux disease without esophagitis: Secondary | ICD-10-CM

## 2021-11-16 DIAGNOSIS — Z8673 Personal history of transient ischemic attack (TIA), and cerebral infarction without residual deficits: Secondary | ICD-10-CM | POA: Diagnosis not present

## 2021-11-16 DIAGNOSIS — E876 Hypokalemia: Secondary | ICD-10-CM | POA: Insufficient documentation

## 2021-11-16 DIAGNOSIS — E669 Obesity, unspecified: Secondary | ICD-10-CM | POA: Insufficient documentation

## 2021-11-16 DIAGNOSIS — Z7982 Long term (current) use of aspirin: Secondary | ICD-10-CM | POA: Diagnosis not present

## 2021-11-16 DIAGNOSIS — Z7984 Long term (current) use of oral hypoglycemic drugs: Secondary | ICD-10-CM | POA: Insufficient documentation

## 2021-11-16 DIAGNOSIS — R42 Dizziness and giddiness: Secondary | ICD-10-CM | POA: Diagnosis not present

## 2021-11-16 DIAGNOSIS — E785 Hyperlipidemia, unspecified: Secondary | ICD-10-CM

## 2021-11-16 DIAGNOSIS — E119 Type 2 diabetes mellitus without complications: Secondary | ICD-10-CM

## 2021-11-16 DIAGNOSIS — R55 Syncope and collapse: Secondary | ICD-10-CM | POA: Diagnosis present

## 2021-11-16 DIAGNOSIS — Z7901 Long term (current) use of anticoagulants: Secondary | ICD-10-CM | POA: Insufficient documentation

## 2021-11-16 DIAGNOSIS — I4891 Unspecified atrial fibrillation: Secondary | ICD-10-CM

## 2021-11-16 LAB — COMPREHENSIVE METABOLIC PANEL
ALT: 28 U/L (ref 0–44)
AST: 29 U/L (ref 15–41)
Albumin: 3.7 g/dL (ref 3.5–5.0)
Alkaline Phosphatase: 56 U/L (ref 38–126)
Anion gap: 11 (ref 5–15)
BUN: 15 mg/dL (ref 8–23)
CO2: 25 mmol/L (ref 22–32)
Calcium: 9.5 mg/dL (ref 8.9–10.3)
Chloride: 105 mmol/L (ref 98–111)
Creatinine, Ser: 0.6 mg/dL (ref 0.44–1.00)
GFR, Estimated: >60 mL/min (ref >60)
Glucose, Bld: 156 mg/dL — ABNORMAL HIGH (ref 70–99)
Potassium: 3.2 mmol/L — ABNORMAL LOW (ref 3.5–5.1)
Sodium: 141 mmol/L (ref 135–145)
Total Bilirubin: 0.6 mg/dL (ref 0.3–1.2)
Total Protein: 6.8 g/dL (ref 6.5–8.1)

## 2021-11-16 LAB — CBC WITH DIFFERENTIAL/PLATELET
Abs Immature Granulocytes: 0.05 10*3/uL (ref 0.00–0.07)
Basophils Absolute: 0.1 10*3/uL (ref 0.0–0.1)
Basophils Relative: 0 %
Eosinophils Absolute: 0.3 10*3/uL (ref 0.0–0.5)
Eosinophils Relative: 2 %
HCT: 37.7 % (ref 36.0–46.0)
Hemoglobin: 11.6 g/dL — ABNORMAL LOW (ref 12.0–15.0)
Immature Granulocytes: 0 %
Lymphocytes Relative: 17 %
Lymphs Abs: 2 10*3/uL (ref 0.7–4.0)
MCH: 26.1 pg (ref 26.0–34.0)
MCHC: 30.8 g/dL (ref 30.0–36.0)
MCV: 84.7 fL (ref 80.0–100.0)
Monocytes Absolute: 0.8 10*3/uL (ref 0.1–1.0)
Monocytes Relative: 7 %
Neutro Abs: 8.7 10*3/uL — ABNORMAL HIGH (ref 1.7–7.7)
Neutrophils Relative %: 74 %
Platelets: 351 10*3/uL (ref 150–400)
RBC: 4.45 MIL/uL (ref 3.87–5.11)
RDW: 15.7 % — ABNORMAL HIGH (ref 11.5–15.5)
WBC: 11.8 10*3/uL — ABNORMAL HIGH (ref 4.0–10.5)
nRBC: 0 % (ref 0.0–0.2)

## 2021-11-16 LAB — TROPONIN I (HIGH SENSITIVITY): Troponin I (High Sensitivity): 5 ng/L (ref <18)

## 2021-11-16 MED ORDER — RISAQUAD PO CAPS
1.0000 | ORAL_CAPSULE | Freq: Every day | ORAL | Status: DC
  Administered 2021-11-17: 1 via ORAL
  Filled 2021-11-16: qty 1

## 2021-11-16 MED ORDER — VITAMIN D 25 MCG (1000 UNIT) PO TABS
4000.0000 [IU] | ORAL_TABLET | Freq: Every day | ORAL | Status: DC
Start: 2021-11-17 — End: 2021-11-17
  Administered 2021-11-17: 4000 [IU] via ORAL
  Filled 2021-11-16: qty 4

## 2021-11-16 MED ORDER — ONDANSETRON HCL 4 MG/2ML IJ SOLN: 4.0000 mg | Freq: Four times a day (QID) | INTRAMUSCULAR | Status: DC | PRN

## 2021-11-16 MED ORDER — ONDANSETRON HCL 4 MG PO TABS: 4.0000 mg | ORAL_TABLET | Freq: Four times a day (QID) | ORAL | Status: DC | PRN

## 2021-11-16 MED ORDER — METOPROLOL TARTRATE 25 MG PO TABS
25.0000 mg | ORAL_TABLET | Freq: Two times a day (BID) | ORAL | Status: DC
  Administered 2021-11-17: 25 mg via ORAL
  Filled 2021-11-16: qty 1

## 2021-11-16 MED ORDER — PANTOPRAZOLE SODIUM 40 MG PO TBEC
40.0000 mg | DELAYED_RELEASE_TABLET | Freq: Every day | ORAL | Status: DC
  Administered 2021-11-17: 40 mg via ORAL
  Filled 2021-11-16: qty 1

## 2021-11-16 MED ORDER — POTASSIUM CHLORIDE IN NACL 20-0.9 MEQ/L-% IV SOLN
INTRAVENOUS | Status: DC
  Filled 2021-11-16: qty 1000

## 2021-11-16 MED ORDER — ACETAMINOPHEN 325 MG PO TABS
650.0000 mg | ORAL_TABLET | Freq: Four times a day (QID) | ORAL | Status: DC | PRN
  Administered 2021-11-17: 650 mg via ORAL
  Filled 2021-11-16: qty 2

## 2021-11-16 MED ORDER — ATORVASTATIN CALCIUM 20 MG PO TABS
20.0000 mg | ORAL_TABLET | Freq: Every day | ORAL | Status: DC
  Administered 2021-11-17: 20 mg via ORAL
  Filled 2021-11-16: qty 1

## 2021-11-16 MED ORDER — APIXABAN 5 MG PO TABS
5.0000 mg | ORAL_TABLET | Freq: Two times a day (BID) | ORAL | Status: DC
  Administered 2021-11-17 (×2): 5 mg via ORAL
  Filled 2021-11-16 (×2): qty 1

## 2021-11-16 MED ORDER — CALCIUM CARBONATE ANTACID 500 MG PO CHEW
500.0000 mg | CHEWABLE_TABLET | Freq: Every day | ORAL | Status: DC
  Administered 2021-11-17: 500 mg via ORAL
  Filled 2021-11-16: qty 3

## 2021-11-16 MED ORDER — ACETAMINOPHEN 650 MG RE SUPP: 650.0000 mg | Freq: Four times a day (QID) | RECTAL | Status: DC | PRN

## 2021-11-16 MED ORDER — HYDROCHLOROTHIAZIDE 12.5 MG PO TABS
12.5000 mg | ORAL_TABLET | Freq: Every day | ORAL | Status: DC
  Filled 2021-11-16: qty 1

## 2021-11-16 MED ORDER — LOSARTAN POTASSIUM-HCTZ 50-12.5 MG PO TABS: 1.0000 | ORAL_TABLET | Freq: Every day | ORAL | Status: DC

## 2021-11-16 MED ORDER — OMEGA-3-ACID ETHYL ESTERS 1 G PO CAPS
2000.0000 mg | ORAL_CAPSULE | Freq: Every day | ORAL | Status: DC
  Administered 2021-11-17: 2000 mg via ORAL
  Filled 2021-11-16: qty 2

## 2021-11-16 MED ORDER — ONDANSETRON 4 MG PO TBDP: 4.0000 mg | ORAL_TABLET | Freq: Once | ORAL | Status: DC

## 2021-11-16 MED ORDER — MAGNESIUM HYDROXIDE 400 MG/5ML PO SUSP: 30.0000 mL | Freq: Every day | ORAL | Status: DC | PRN

## 2021-11-16 MED ORDER — POTASSIUM CHLORIDE 20 MEQ PO PACK
40.0000 meq | PACK | Freq: Once | ORAL | Status: AC
  Administered 2021-11-17: 40 meq via ORAL
  Filled 2021-11-16: qty 2

## 2021-11-16 MED ORDER — TRAZODONE HCL 50 MG PO TABS
25.0000 mg | ORAL_TABLET | Freq: Every evening | ORAL | Status: DC | PRN
  Administered 2021-11-17: 25 mg via ORAL
  Filled 2021-11-16: qty 1

## 2021-11-16 MED ORDER — NIACIN 500 MG PO TABS
500.0000 mg | ORAL_TABLET | Freq: Every day | ORAL | Status: DC
  Administered 2021-11-17: 500 mg via ORAL
  Filled 2021-11-16: qty 1

## 2021-11-16 MED ORDER — HYDROCHLOROTHIAZIDE 12.5 MG PO TABS
12.5000 mg | ORAL_TABLET | Freq: Every day | ORAL | Status: DC
  Administered 2021-11-17 (×2): 12.5 mg via ORAL
  Filled 2021-11-16: qty 1

## 2021-11-16 MED ORDER — ONDANSETRON HCL 4 MG/2ML IJ SOLN
4.0000 mg | Freq: Once | INTRAMUSCULAR | Status: AC
  Administered 2021-11-16: 4 mg via INTRAVENOUS

## 2021-11-16 MED ORDER — LOSARTAN POTASSIUM 50 MG PO TABS
50.0000 mg | ORAL_TABLET | Freq: Every day | ORAL | Status: DC
  Administered 2021-11-17 (×2): 50 mg via ORAL
  Filled 2021-11-16 (×2): qty 1

## 2021-11-16 NOTE — Assessment & Plan Note (Signed)
-   We will continue Eliquis. - We will continue flecainide and hold off Lopressor. - Cardiology consult will be obtained as mentioned above.

## 2021-11-16 NOTE — H&P (Signed)
[place]   PATIENT NAME: Jane Moran    MR#:  [id number]  DATE OF BIRTH:  1951/02/13  DATE OF ADMISSION:  11/16/2021  PRIMARY CARE PHYSICIAN: Adin Hector, MD   Patient is coming from: Home  REQUESTING/REFERRING PHYSICIAN: Blake Divine, MD  CHIEF COMPLAINT:   Chief Complaint  Patient presents with   Loss of Consciousness    HISTORY OF PRESENT ILLNESS:  Jane Moran is a 71 y.o. Caucasian female with medical history significant for osteoarthritis, CVA, type 2 diabetes mellitus, GERD, hypertension, dyslipidemia, migraine, paroxysmal atrial fibrillation on Eliquis and obstructive sleep apnea, who presented to the emergency room with acute onset of syncope.  She was at her dentist office around 3 PM and 1 day later sat her down she had dizziness and vertigo.  She stood up and lost consciousness and fell backward losing consciousness for a few seconds.  She was experiencing nausea without vomiting.  No headache or blurred vision.  She denied any chest pain or palpitations.  No recent fever or chills.  No cough or wheezing or dyspnea.  No dysuria, oliguria or hematuria or flank pain.  No paresthesias or focal muscle weakness.  No dysphagia or dysarthria or facial droop.  ED Course: Upon presentation to the emergency room, BP was 161/75 with a heart rate of 58 and later 77 with otherwise normal vital signs.  Labs revealed revealed hypokalemia with a heart rate of 3.2 and blood glucose was 156.  High-sensitivity troponin I was 5 and CBC showed leukocytosis of 11.8 with neutrophilia.  Hemoglobin was 11.6 and hematocrit 37.7. EKG as reviewed by me : Initial EKG showed atrial fibrillation with slow ventricular response of 59 with low voltage QRS. EKG showed atrial flutter with variable AV block and a rate of 92 with left axis deviation.  The patient will be admitted to an observation cardiac telemetry bed for further evaluation and management. PAST MEDICAL HISTORY:   Past  Medical History:  Diagnosis Date   Arthritis    Benign breast lumps    Cancer ([place])    skin   Complication of anesthesia    CVA (cerebral vascular accident) ([place])    Diabetes mellitus without complication ([place])    Diffuse cystic mastopathy    Dysrhythmia    Eczema    Esophageal reflux    Family history of malignant neoplasm of breast    H/O cystitis    H/O endoscopy 2012   Headache    migranes   Hemorrhoid    Hyperglycemia    Hyperlipemia    Hypertension 1990   Migraines    Paroxysmal A-fib (HCC)    PONV (postoperative nausea and vomiting)    Pre-diabetes    Screening cholesterol level    Screening for obesity    Sleep apnea    Special screening for malignant neoplasms, colon    Tubular adenoma of colon    Vitamin D deficiency     PAST SURGICAL HISTORY:   Past Surgical History:  Procedure Laterality Date   BREAST BIOPSY Left 2011   benign biopsy with Dr. Jamal Collin   BREAST BIOPSY     BREAST SURGERY     breast biopsy   BUNIONECTOMY  1972   BUNIONECTOMY     COLONOSCOPY  2007   COLONOSCOPY WITH PROPOFOL N/A 11/13/2014   Procedure: COLONOSCOPY WITH PROPOFOL;  Surgeon: Josefine Class, MD;  Location: [facility] ENDOSCOPY;  Service: Endoscopy;  Laterality: N/A;   COLONOSCOPY  WITH PROPOFOL N/A 03/05/2018   Procedure: COLONOSCOPY WITH PROPOFOL;  Surgeon: Manya Silvas, MD;  Location: [facility] ENDOSCOPY;  Service: Endoscopy;  Laterality: N/A;   COLONOSCOPY WITH PROPOFOL N/A 04/01/2020   Procedure: COLONOSCOPY WITH PROPOFOL;  Surgeon: Toledo, Benay Pike, MD;  Location: [facility] ENDOSCOPY;  Service: Gastroenterology;  Laterality: N/A;  COVID POSITIVE ON 9/30 AT [facility]   CYST REMOVAL NECK  [id number]   CYSTECTOMY     ESOPHAGOGASTRODUODENOSCOPY (EGD) WITH PROPOFOL N/A 12/16/2016   Procedure: ESOPHAGOGASTRODUODENOSCOPY (EGD) WITH PROPOFOL;  Surgeon: Manya Silvas, MD;  Location: [facility] ENDOSCOPY;  Service: Endoscopy;  Laterality: N/A;   ESOPHAGOGASTRODUODENOSCOPY (EGD) WITH  PROPOFOL N/A 04/01/2020   Procedure: ESOPHAGOGASTRODUODENOSCOPY (EGD) WITH PROPOFOL;  Surgeon: Toledo, Benay Pike, MD;  Location: [facility] ENDOSCOPY;  Service: Gastroenterology;  Laterality: N/A;   FOOT SURGERY     KNEE ARTHROSCOPY     KNEE SURGERY Left 2011   LIPOMA EXCISION     bvack of neck    WISDOM TOOTH EXTRACTION      SOCIAL HISTORY:   Social History   Tobacco Use   Smoking status: Never   Smokeless tobacco: Never  Substance Use Topics   Alcohol use: No    FAMILY HISTORY:   Family History  Problem Relation Age of Onset   Breast cancer Mother 41   Lung cancer Father    Prostate cancer Maternal Grandfather    Breast cancer Paternal Aunt    Breast cancer Cousin        paternal side    DRUG ALLERGIES:   Allergies  Allergen Reactions   Codeine Nausea Only   Zinc Oxide Rash    REVIEW OF SYSTEMS:   ROS As per history of present illness. All pertinent systems were reviewed above. Constitutional, HEENT, cardiovascular, respiratory, GI, GU, musculoskeletal, neuro, psychiatric, endocrine, integumentary and hematologic systems were reviewed and are otherwise negative/unremarkable except for positive findings mentioned above in the HPI.   MEDICATIONS AT HOME:   Prior to Admission medications   Medication Sig Start Date End Date Taking? Authorizing Provider  apixaban (ELIQUIS) 5 MG TABS tablet Take 1 tablet (5 mg total) by mouth 2 (two) times daily. 07/03/18   Fritzi Mandes, MD  aspirin-acetaminophen-caffeine [facility] MIGRAINE) [phone] MG tablet Take by mouth every 6 (six) hours as needed for headache.    [provider]  atorvastatin (LIPITOR) 20 MG tablet Take 1 tablet (20 mg total) by mouth daily at 6 PM. 07/03/18   Fritzi Mandes, MD  blood glucose meter kit and supplies by Other route as directed. Dispense based on patient and insurance preference. Use up to four times daily as directed. (FOR ICD-10 E10.9, E11.9).    [provider]  Calcium Carbonate  (CALCIUM 600 PO) Take 600 mg by mouth daily.    [provider]  Cholecalciferol (VITAMIN D3) 50 MCG (2000 UT) TABS Take 4,000 Units by mouth daily.    [provider]  dexlansoprazole (DEXILANT) 60 MG capsule Take 60-120 mg by mouth daily.     [provider]  Glucose Blood (COOL BLOOD GLUCOSE TEST STRIPS VI) by In Vitro route.    [provider]  hydrochlorothiazide (HYDRODIURIL) 12.5 MG tablet Take 12.5 mg by mouth daily.    [provider]  LACTOBACILLUS ACID-PECTIN PO Take by mouth.    [provider]  losartan-hydrochlorothiazide (HYZAAR) 50-12.5 MG tablet Take 1 tablet by mouth daily. 06/05/18   [provider]  metFORMIN (GLUCOPHAGE) 500 MG tablet Take 500  mg by mouth 2 (two) times daily with a meal.     [provider]  metoprolol tartrate (LOPRESSOR) 25 MG tablet Take 1 tablet (25 mg total) by mouth 2 (two) times daily. 07/03/18   Fritzi Mandes, MD  niacin 500 MG tablet Take 500 mg by mouth daily.    [provider]  Omega 3 1000 MG CAPS Take 2,000 mg by mouth daily.    [provider]  Probiotic Product (PROBIOTIC DAILY) CAPS Take 1 capsule by mouth daily.    [provider]      VITAL SIGNS:  Blood pressure 139/83, pulse 77, temperature 97.7 F (36.5 C), temperature source Oral, resp. rate 14, height '5\' 1"'  (1.549 m), weight 116 kg, SpO2 94 %.  PHYSICAL EXAMINATION:  Physical Exam  GENERAL:  71 y.o.-year-old Caucasian female patient lying in the bed with no acute distress.  EYES: Pupils equal, round, reactive to Huss[place] and accommodation. No scleral icterus. Extraocular muscles intact.  HEENT: Head atraumatic, normocephalic. Oropharynx and nasopharynx clear.  NECK:  Supple, no jugular venous distention. No thyroid enlargement, no tenderness.  LUNGS: Normal breath sounds bilaterally, no wheezing, rales,rhonchi or crepitation. No use of accessory muscles of respiration.  CARDIOVASCULAR:  Irregularly irregular S1, S2 normal. No murmurs, rubs, or gallops.  ABDOMEN: Soft, nondistended, nontender. Bowel sounds present. No organomegaly or mass.  EXTREMITIES: No pedal edema, cyanosis, or clubbing.  NEUROLOGIC: Cranial nerves II through XII are intact. Muscle strength 5/5 in all extremities. Sensation intact. Gait not checked.  PSYCHIATRIC: The patient is alert and oriented x 3.  Normal affect and good eye contact. SKIN: No obvious rash, lesion, or ulcer.   LABORATORY PANEL:   CBC Recent Labs  Lab 11/16/21 1639  WBC 11.8*  HGB 11.6*  HCT 37.7  PLT 351   ------------------------------------------------------------------------------------------------------------------  Chemistries  Recent Labs  Lab 11/16/21 1639  NA 141  K 3.2*  CL 105  CO2 25  GLUCOSE 156*  BUN 15  CREATININE 0.60  CALCIUM 9.5  AST 29  ALT 28  ALKPHOS 56  BILITOT 0.6   ------------------------------------------------------------------------------------------------------------------  Cardiac Enzymes No results for input(s): "TROPONINI" in the last 168 hours. ------------------------------------------------------------------------------------------------------------------  RADIOLOGY:  No results found.    IMPRESSION AND PLAN:  Assessment and Plan: * Syncope - The patient be admitted to an observation cardiac telemetry bed. - She will be monitored for further arrhythmias. - We will follow serial troponins. - We will check orthostatics. - Cardiology consult will be obtained. - I notified Dr. Nehemiah Massed about the patient.  Hypokalemia - We will replace potassium and check magnesium level.  Atrial fibrillation with slow ventricular response (HCC) - We will continue Eliquis. - We will continue flecainide and hold off Lopressor. - Cardiology consult will be obtained as mentioned above.  Essential hypertension - We will continue his Cozaar and hold off Lopressor.  Dyslipidemia - We  will continue statin therapy.  Controlled type 2 diabetes mellitus without complication, without long-term current use of insulin (HCC) - We will place her on supplemental coverage with NovoLog. - We will hold off metformin.  GERD without esophagitis - We will continue PPI therapy.   DVT prophylaxis: Lovenox.  Advanced Care Planning:  Code Status: full code.  Family Communication:  The plan of care was discussed in details with the patient (and family). I answered all questions. The patient agreed to proceed with the above mentioned plan. Further management will depend upon hospital course. Disposition Plan: Back to previous home  environment Consults called: Cardiology. All the records are reviewed and case discussed with ED provider.  Status is: Observation   I certify that at the time of admission, it is my clinical judgment that the patient will require  hospital care extending less than 2 midnights.                            Dispo: The patient is from: Home              Anticipated d/c is to: Home              Patient currently is not medically stable to d/c.              Difficult to place patient: No  Christel Mormon M.D on 11/16/2021 at 10:54 PM  [facility]   From 7 PM-7 AM, contact night-coverage [email or web address]  CC: Primary care physician; Adin Hector, MD

## 2021-11-16 NOTE — Assessment & Plan Note (Signed)
-   We will continue PPI therapy 

## 2021-11-16 NOTE — ED Provider Triage Note (Signed)
  Emergency Medicine Provider Triage Evaluation Note  Jane Moran , a 71 y.o.female,  was evaluated in triage.  Pt complains of syncope/near syncope.  She states that she was at the dentist today around 1500 when she sat up, felt a room spinning sensation, and believes that she may have passed out.  Since then she has had persistent nausea and lightheaded sensation.    Review of Systems  Positive: Dizziness, nausea, vomiting Negative: Denies fever, chest pain, diarrhea  Physical Exam   Vitals:   11/16/21 1621  BP: (!) 161/75  Pulse: (!) 58  Resp: 17  Temp: 97.7 F (36.5 C)  SpO2: 94%   Gen:   Awake, appears uncomfortable. Resp:  Normal effort  MSK:   Moves extremities without difficulty  Other:    Medical Decision Making  Given the patient's initial medical screening exam, the following diagnostic evaluation has been ordered. The patient will be placed in the appropriate treatment space, once one is available, to complete the evaluation and treatment. I have discussed the plan of care with the patient and I have advised the patient that an ED physician or mid-level practitioner will reevaluate their condition after the test results have been received, as the results may give them additional insight into the type of treatment they may need.    Diagnostics: Labs, EKG  Treatments: none immediately   Teodoro Spray, [place] 11/16/21 1639

## 2021-11-16 NOTE — ED Triage Notes (Signed)
First Nurse Note:  Arrives from dentists office.  After laying in dental chair for about an hour, patient sat up felt a little dizzy and then while walking to the bathroom, fell and now c/o nausea.  Per dental office report, patient had only had a couple of meal shakes today.  Arrives to ED, AAOx3/  skin warm and dry. NAD

## 2021-11-16 NOTE — Assessment & Plan Note (Signed)
-   The patient be admitted to an observation cardiac telemetry bed. - She will be monitored for further arrhythmias. - We will follow serial troponins. - We will check orthostatics. - Cardiology consult will be obtained. - I notified Dr. Nehemiah Massed about the patient.

## 2021-11-16 NOTE — ED Provider Notes (Signed)
[facility] Provider Note    Event Date/Time   First MD Initiated Contact with Patient 11/16/21 2001     (approximate)   History   Chief Complaint Loss of Consciousness   HPI  Jane Moran is a 71 y.o. female with past medical history of hypertension, diabetes, atrial fibrillation on Eliquis, stroke, and GERD who presents to the ED following near syncope.  Patient reports that she was at her dentist office and when they lifted up the chair she began to feel lightheaded and dizzy.  She attempted to stand up and walk, but felt like she was going to pass out and ended up falling backwards.  She states that she almost lost consciousness but did not fully "blackout."  She states she fell backwards onto her bottom, denies hitting her head.  She denies any associated chest pain or shortness of breath.  She does report that she has had some intermittent fluttering in her chest today like she is going into A-fib.  She denies any fluttering currently.     Physical Exam   Triage Vital Signs: ED Triage Vitals  Enc Vitals Group     BP 11/16/21 1621 (!) 161/75     Pulse Rate 11/16/21 1621 (!) 58     Resp 11/16/21 1621 17     Temp 11/16/21 1621 97.7 F (36.5 C)     Temp Source 11/16/21 1621 Oral     SpO2 11/16/21 1621 94 %     Weight 11/16/21 1634 255 lb 11.7 oz (116 kg)     Height 11/16/21 1634 '5\' 1"'$  (1.549 m)     Head Circumference --      Peak Flow --      Pain Score --      Pain Loc --      Pain Edu? --      Excl. in [place]? --     Most recent vital signs: Vitals:   11/16/21 1621 11/16/21 2140  BP: (!) 161/75 139/83  Pulse: (!) 58 77  Resp: 17 14  Temp: 97.7 F (36.5 C)   SpO2: 94% 94%    Constitutional: Alert and oriented. Eyes: Conjunctivae are normal. Head: Atraumatic. Nose: No congestion/rhinnorhea. Mouth/Throat: Mucous membranes are moist.  Neck: No midline cervical spine tenderness to palpation. Cardiovascular: Normal rate, irregularly  irregular rhythm. Grossly normal heart sounds.  2+ radial pulses bilaterally. Respiratory: Normal respiratory effort.  No retractions. Lungs CTAB. Gastrointestinal: Soft and nontender. No distention. Musculoskeletal: No lower extremity tenderness nor edema.  Neurologic:  Normal speech and language. No gross focal neurologic deficits are appreciated.    ED Results / Procedures / Treatments   Labs (all labs ordered are listed, but only abnormal results are displayed) Labs Reviewed  COMPREHENSIVE METABOLIC PANEL - Abnormal; Notable for the following components:      Result Value   Potassium 3.2 (*)    Glucose, Bld 156 (*)    All other components within normal limits  CBC WITH DIFFERENTIAL/PLATELET - Abnormal; Notable for the following components:   WBC 11.8 (*)    Hemoglobin 11.6 (*)    RDW 15.7 (*)    Neutro Abs 8.7 (*)    All other components within normal limits  TSH  BASIC METABOLIC PANEL  CBC  HIV ANTIBODY (ROUTINE TESTING W REFLEX)  TROPONIN I (HIGH SENSITIVITY)  TROPONIN I (HIGH SENSITIVITY)     EKG  ED ECG REPORT I, Blake Divine, the attending physician, personally viewed and  interpreted this ECG.   Date: 11/16/2021  EKG Time: 16:42  Rate: 59  Rhythm: atrial fibrillation  Axis: Normal  Intervals:none  ST&T Change: None   ED ECG REPORT I, Blake Divine, the attending physician, personally viewed and interpreted this ECG.   Date: 11/16/2021  EKG Time: 21:38  Rate: 92  Rhythm: Atrial flutter with variable block  Axis: LAD  Intervals:none  ST&T Change: None  PROCEDURES:  Critical Care performed: No  Procedures   MEDICATIONS ORDERED IN ED: Medications  atorvastatin (LIPITOR) tablet 20 mg (has no administration in time range)  hydrochlorothiazide (HYDRODIURIL) tablet 12.5 mg (has no administration in time range)  metoprolol tartrate (LOPRESSOR) tablet 25 mg (has no administration in time range)  pantoprazole (PROTONIX) EC tablet 40 mg (has no  administration in time range)  acidophilus (RISAQUAD) capsule 1 capsule (has no administration in time range)  apixaban (ELIQUIS) tablet 5 mg (has no administration in time range)  calcium carbonate (TUMS - dosed in mg elemental calcium) chewable tablet 500 mg (has no administration in time range)  cholecalciferol (VITAMIN D3) 25 MCG (1000 UNIT) tablet 4,000 Units (has no administration in time range)  niacin (VITAMIN B3) tablet 500 mg (has no administration in time range)  omega-3 acid ethyl esters (LOVAZA) capsule 2,000 mg (has no administration in time range)  0.9 % NaCl with KCl 20 mEq/ L  infusion (has no administration in time range)  acetaminophen (TYLENOL) tablet 650 mg (has no administration in time range)    Or  acetaminophen (TYLENOL) suppository 650 mg (has no administration in time range)  traZODone (DESYREL) tablet 25 mg (has no administration in time range)  magnesium hydroxide (MILK OF MAGNESIA) suspension 30 mL (has no administration in time range)  ondansetron (ZOFRAN) tablet 4 mg (has no administration in time range)    Or  ondansetron (ZOFRAN) injection 4 mg (has no administration in time range)  potassium chloride (KLOR-CON) packet 40 mEq (has no administration in time range)  losartan (COZAAR) tablet 50 mg (has no administration in time range)    And  hydrochlorothiazide (HYDRODIURIL) tablet 12.5 mg (has no administration in time range)  ondansetron (ZOFRAN) injection 4 mg (4 mg Intravenous Given 11/16/21 1643)     IMPRESSION / MDM / ASSESSMENT AND PLAN / ED COURSE  I reviewed the triage vital signs and the nursing notes.                              71 y.o. female with past medical history of hypertension, diabetes, atrial fibrillation on Eliquis, GERD, and stroke who presents to the ED complaining of near syncopal episode at her dentist office earlier today associated with some palpitations.  Patient's presentation is most consistent with acute presentation with  potential threat to life or bodily function.  Differential diagnosis includes, but is not limited to, arrhythmia, ACS, PE, electrolyte abnormality, AKI, vasovagal episode, traumatic injury.  Patient well-appearing and in no acute distress, vital signs are unremarkable.  Initial EKG shows atrial fibrillation with slow ventricular response, no ischemic changes noted.  On reassessment, heart rate is improved and she now appears to be in atrial flutter with variable block, again no ischemic changes noted.  Troponin within normal limits and I doubt ACS or PE.  I am concerned that her near syncopal episode was the result of arrhythmia and she would benefit from admission for further observation.  Remainder of labs are reassuring with no  significant anemia, leukocytosis, electrolyte abnormality, or AKI.  Case discussed with hospitalist for admission.      FINAL CLINICAL IMPRESSION(S) / ED DIAGNOSES   Final diagnoses:  Near syncope  Atrial fibrillation with slow ventricular response ([place])     Rx / DC Orders   ED Discharge Orders     None        Note:  This document was prepared using Dragon voice recognition software and may include unintentional dictation errors.   Blake Divine, MD 11/17/21 [id number]

## 2021-11-16 NOTE — ED Triage Notes (Signed)
Pt went to dentist today around 3. When they sat her up, the room was spinning. Pt thinks she passed out when she stood up and fell. Nausea continues. Denies abd pain or CP.

## 2021-11-16 NOTE — Assessment & Plan Note (Signed)
-   We will continue his Cozaar and hold off Lopressor.

## 2021-11-16 NOTE — Assessment & Plan Note (Signed)
-   We will place her on supplemental coverage with NovoLog. - We will hold off metformin. 

## 2021-11-16 NOTE — Assessment & Plan Note (Signed)
-   We will continue statin therapy. 

## 2021-11-16 NOTE — Assessment & Plan Note (Signed)
-  We will replace potassium and check magnesium level. 

## 2021-11-17 DIAGNOSIS — E876 Hypokalemia: Secondary | ICD-10-CM

## 2021-11-17 DIAGNOSIS — R42 Dizziness and giddiness: Secondary | ICD-10-CM | POA: Diagnosis not present

## 2021-11-17 DIAGNOSIS — E66813 Obesity, class 3: Secondary | ICD-10-CM

## 2021-11-17 DIAGNOSIS — I4891 Unspecified atrial fibrillation: Secondary | ICD-10-CM | POA: Diagnosis not present

## 2021-11-17 DIAGNOSIS — E119 Type 2 diabetes mellitus without complications: Secondary | ICD-10-CM | POA: Diagnosis not present

## 2021-11-17 DIAGNOSIS — E785 Hyperlipidemia, unspecified: Secondary | ICD-10-CM | POA: Diagnosis not present

## 2021-11-17 LAB — HEMOGLOBIN A1C
Hgb A1c MFr Bld: 6.2 % — ABNORMAL HIGH (ref 4.8–5.6)
Mean Plasma Glucose: 131.24 mg/dL

## 2021-11-17 LAB — CBC
HCT: 37.6 % (ref 36.0–46.0)
Hemoglobin: 11.2 g/dL — ABNORMAL LOW (ref 12.0–15.0)
MCH: 26.1 pg (ref 26.0–34.0)
MCHC: 29.8 g/dL — ABNORMAL LOW (ref 30.0–36.0)
MCV: 87.6 fL (ref 80.0–100.0)
Platelets: 333 10*3/uL (ref 150–400)
RBC: 4.29 MIL/uL (ref 3.87–5.11)
RDW: 16 % — ABNORMAL HIGH (ref 11.5–15.5)
WBC: 12.9 10*3/uL — ABNORMAL HIGH (ref 4.0–10.5)
nRBC: 0 % (ref 0.0–0.2)

## 2021-11-17 LAB — TROPONIN I (HIGH SENSITIVITY): Troponin I (High Sensitivity): 11 ng/L (ref <18)

## 2021-11-17 LAB — TSH: TSH: 1.405 u[IU]/mL (ref 0.350–4.500)

## 2021-11-17 LAB — BASIC METABOLIC PANEL
Anion gap: 13 (ref 5–15)
BUN: 13 mg/dL (ref 8–23)
CO2: 24 mmol/L (ref 22–32)
Calcium: 9.4 mg/dL (ref 8.9–10.3)
Chloride: 104 mmol/L (ref 98–111)
Creatinine, Ser: 0.59 mg/dL (ref 0.44–1.00)
GFR, Estimated: >60 mL/min (ref >60)
Glucose, Bld: 103 mg/dL — ABNORMAL HIGH (ref 70–99)
Potassium: 4.1 mmol/L (ref 3.5–5.1)
Sodium: 141 mmol/L (ref 135–145)

## 2021-11-17 LAB — CBG MONITORING, ED
Glucose-Capillary: 123 mg/dL — ABNORMAL HIGH (ref 70–99)
Glucose-Capillary: 121 mg/dL — ABNORMAL HIGH (ref 70–99)
Glucose-Capillary: 126 mg/dL — ABNORMAL HIGH (ref 70–99)

## 2021-11-17 LAB — MAGNESIUM: Magnesium: 2 mg/dL (ref 1.7–2.4)

## 2021-11-17 MED ORDER — INSULIN ASPART 100 UNIT/ML IJ SOLN: 0.0000 [IU] | Freq: Every day | INTRAMUSCULAR | Status: DC

## 2021-11-17 MED ORDER — FLECAINIDE ACETATE 100 MG PO TABS
100.0000 mg | ORAL_TABLET | Freq: Two times a day (BID) | ORAL | Status: DC
  Administered 2021-11-17: 100 mg via ORAL
  Filled 2021-11-17: qty 1

## 2021-11-17 MED ORDER — METOPROLOL TARTRATE 25 MG PO TABS
12.5000 mg | ORAL_TABLET | Freq: Two times a day (BID) | ORAL | 1 refills | Status: AC
Start: 2021-11-17 — End: ?

## 2021-11-17 MED ORDER — INSULIN ASPART 100 UNIT/ML IJ SOLN
0.0000 [IU] | Freq: Three times a day (TID) | INTRAMUSCULAR | Status: DC
  Administered 2021-11-17: 1 [IU] via SUBCUTANEOUS
  Filled 2021-11-17: qty 1

## 2021-11-17 MED ORDER — METOPROLOL TARTRATE 25 MG PO TABS: 12.5000 mg | ORAL_TABLET | Freq: Two times a day (BID) | ORAL | Status: DC

## 2021-11-17 NOTE — ED Provider Notes (Incomplete)
[facility] Provider Note    Event Date/Time   First MD Initiated Contact with Patient 11/16/21 2001     (approximate)   History   Chief Complaint Loss of Consciousness   HPI  Jane Moran is a 71 y.o. female with past medical history of hypertension, diabetes, atrial fibrillation on Eliquis, stroke, and GERD who presents to the ED following near syncope.  Patient reports that she was at her dentist office and when they lifted up the chair she began to feel lightheaded and dizzy.  She attempted to stand up and walk, but felt like she was going to pass out and ended up falling backwards.  She states that she almost lost consciousness but did not fully "blackout."  She states she fell backwards onto her bottom, denies hitting her head.  She denies any associated chest pain or shortness of breath.  She does report that she has had some intermittent fluttering in her chest today like she is going into A-fib.  She denies any fluttering currently.     Physical Exam   Triage Vital Signs: ED Triage Vitals  Enc Vitals Group     BP 11/16/21 1621 (!) 161/75     Pulse Rate 11/16/21 1621 (!) 58     Resp 11/16/21 1621 17     Temp 11/16/21 1621 97.7 F (36.5 C)     Temp Source 11/16/21 1621 Oral     SpO2 11/16/21 1621 94 %     Weight 11/16/21 1634 255 lb 11.7 oz (116 kg)     Height 11/16/21 1634 '5\' 1"'$  (1.549 m)     Head Circumference --      Peak Flow --      Pain Score --      Pain Loc --      Pain Edu? --      Excl. in [place]? --     Most recent vital signs: Vitals:   11/16/21 1621 11/16/21 2140  BP: (!) 161/75 139/83  Pulse: (!) 58 77  Resp: 17 14  Temp: 97.7 F (36.5 C)   SpO2: 94% 94%    Constitutional: Alert and oriented. Eyes: Conjunctivae are normal. Head: Atraumatic. Nose: No congestion/rhinnorhea. Mouth/Throat: Mucous membranes are moist.  Neck: No midline cervical spine tenderness to palpation. Cardiovascular: Normal rate, irregularly  irregular rhythm. Grossly normal heart sounds.  2+ radial pulses bilaterally. Respiratory: Normal respiratory effort.  No retractions. Lungs CTAB. Gastrointestinal: Soft and nontender. No distention. Musculoskeletal: No lower extremity tenderness nor edema.  Neurologic:  Normal speech and language. No gross focal neurologic deficits are appreciated.    ED Results / Procedures / Treatments   Labs (all labs ordered are listed, but only abnormal results are displayed) Labs Reviewed  COMPREHENSIVE METABOLIC PANEL - Abnormal; Notable for the following components:      Result Value   Potassium 3.2 (*)    Glucose, Bld 156 (*)    All other components within normal limits  CBC WITH DIFFERENTIAL/PLATELET - Abnormal; Notable for the following components:   WBC 11.8 (*)    Hemoglobin 11.6 (*)    RDW 15.7 (*)    Neutro Abs 8.7 (*)    All other components within normal limits  TROPONIN I (HIGH SENSITIVITY)  TROPONIN I (HIGH SENSITIVITY)     EKG  ED ECG REPORT I, Blake Divine, the attending physician, personally viewed and interpreted this ECG.   Date: 11/16/2021  EKG Time: 16:42  Rate: 59  Rhythm: atrial fibrillation  Axis: Normal  Intervals:none  ST&T Change: None   ED ECG REPORT I, Blake Divine, the attending physician, personally viewed and interpreted this ECG.   Date: 11/16/2021  EKG Time: 21:38  Rate: 92  Rhythm: Atrial flutter with variable block  Axis: LAD  Intervals:none  ST&T Change: None  PROCEDURES:  Critical Care performed: {CriticalCareYesNo:19197::"Yes, see critical care procedure note(s)","No"}  Procedures   MEDICATIONS ORDERED IN ED: Medications  ondansetron (ZOFRAN) injection 4 mg (4 mg Intravenous Given 11/16/21 1643)     IMPRESSION / MDM / ASSESSMENT AND PLAN / ED COURSE  I reviewed the triage vital signs and the nursing notes.                              71 y.o. female ***   Patient's presentation is most consistent with {EM  COPA:27473}  Differential diagnosis includes, but is not limited to, ***  ***  {**The patient is on the cardiac monitor to evaluate for evidence of arrhythmia and/or significant heart rate changes.**}      FINAL CLINICAL IMPRESSION(S) / ED DIAGNOSES   Final diagnoses:  None     Rx / DC Orders   ED Discharge Orders     None        Note:  This document was prepared using Dragon voice recognition software and may include unintentional dictation errors.

## 2021-11-17 NOTE — ED Notes (Signed)
Patient in no acute distress, 5am lab work sent . Continuous monitoring . Denies complaints at this time .

## 2021-11-17 NOTE — Discharge Summary (Signed)
Physician Discharge Summary  Jane Moran IRC:[id number] DOB: November 15, 1950 DOA: 11/16/2021  PCP: Adin Hector, MD  Admit date: 11/16/2021 Discharge date: 11/17/2021 Discharging to: home Recommendations for Outpatient Follow-up:  Please follow-up on potassium at next office visit  Consults:  Cardiology Procedures:  None   Discharge Diagnoses:   Principal Problem:   Vertigo Active Problems:   Atrial fibrillation with slow ventricular response ([place])   Hypokalemia   Essential hypertension   Dyslipidemia   Controlled type 2 diabetes mellitus without complication, without long-term current use of insulin ([place])   GERD without esophagitis   Obesity, Class III, BMI 40-49.9 (morbid obesity) [facility])     Hospital Course: This is a 71 year old female with osteoarthritis CVA, diabetes mellitus type 2, GERD, hypertension, migraines, PAF, OSA who came to the hospital for vertigo and retching.  The patient had just received a crown at her dentist office.  When she was sitting up in the dental chair she began to feel the room moving.  She felt it resolved but then when she stood it recurred again and at this point she tripped and she fell. She subsequently laid on the floor of the dental office retching. In the ED, heart rate was in the 50s and later in the 70s.  EKG suggested atrial fibrillation with slow ventricular response.  Second EKG showed a flutter with variable AV block and left axis deviation. Cardiology was consulted  Principal Problem:   Vertigo -Has resolved and patient has been ambulating in the ED.-She has had no recent viral infections- she had no medications during her procedure other than goal numbing   Active Problems:   Atrial fibrillation with slow ventricular response (HCC) -Per cardiology, decrease low pressor to 12.5 mg twice a day and resume all other home medications - She will need to follow-up with Dr. Nehemiah Massed in 1 to 2 weeks    Hypokalemia -Potassium was3.2 and  that has been replaced to 4.1    Essential hypertension   Dyslipidemia   Controlled type 2 diabetes mellitus without complication, without long-term current use of insulin (HCC)   GERD without esophagitis  Morbid obesity class III  Body mass index is 48.32 kg/m.           Discharge Instructions  Discharge Instructions     Diet - low sodium heart healthy   Complete by: As directed    Diet Carb Modified   Complete by: As directed    Increase activity slowly   Complete by: As directed       Allergies as of 11/17/2021       Reactions   Codeine Nausea Only   Zinc Oxide Rash        Medication List     TAKE these medications    apixaban 5 MG Tabs tablet Commonly known as: ELIQUIS Take 1 tablet (5 mg total) by mouth 2 (two) times daily.   aspirin-acetaminophen-caffeine 510-258-52 MG tablet Commonly known as: EXCEDRIN MIGRAINE Take by mouth every 6 (six) hours as needed for headache.   atorvastatin 20 MG tablet Commonly known as: LIPITOR Take 1 tablet (20 mg total) by mouth daily at 6 PM.   CALCIUM 600 PO Take 600 mg by mouth daily.   cyanocobalamin 1000 MCG tablet Commonly known as: VITAMIN B12 Take by mouth.   dexlansoprazole 60 MG capsule Commonly known as: DEXILANT Take 60-120 mg by mouth daily.   flecainide 100 MG tablet Commonly known as: TAMBOCOR Take 100 mg by mouth  2 (two) times daily.   hydrochlorothiazide 12.5 MG tablet Commonly known as: HYDRODIURIL Take 12.5 mg by mouth daily.   losartan 50 MG tablet Commonly known as: COZAAR Take 50 mg by mouth daily.   metFORMIN 500 MG tablet Commonly known as: GLUCOPHAGE Take 500 mg by mouth 2 (two) times daily with a meal.   metoprolol tartrate 25 MG tablet Commonly known as: LOPRESSOR Take 0.5 tablets (12.5 mg total) by mouth 2 (two) times daily. What changed: how much to take   niacin 500 MG tablet Commonly known as: VITAMIN B3 Take 500 mg by mouth daily.   Omega 3 1000 MG Caps Take  2,000 mg by mouth daily.   Ozempic (0.25 or 0.5 MG/DOSE) 2 MG/3ML Sopn Generic drug: Semaglutide(0.25 or 0.'5MG'$ /DOS) Inject 0.5 mg into the skin once a week.   Probiotic Daily Caps Take 1 capsule by mouth daily.   Vitamin D3 50 MCG (2000 UT) Tabs Take 4,000 Units by mouth daily.        Follow-up Information     Corey Skains, MD. Go in 1 week(s).   Specialty: Cardiology Contact information: [address] [facility] [place] [address] [phone]                    The results of significant diagnostics from this hospitalization (including imaging, microbiology, ancillary and laboratory) are listed below for reference.    No results found. Labs:   Basic Metabolic Panel: Recent Labs  Lab 11/16/21 1639 11/17/21 0520  NA 141 141  K 3.2* 4.1  CL 105 104  CO2 25 24  GLUCOSE 156* 103*  BUN 15 13  CREATININE 0.60 0.59  CALCIUM 9.5 9.4  MG  --  2.0     CBC: Recent Labs  Lab 11/16/21 1639 11/17/21 0520  WBC 11.8* 12.9*  NEUTROABS 8.7*  --   HGB 11.6* 11.2*  HCT 37.7 37.6  MCV 84.7 87.6  PLT 351 333         SIGNED:   Debbe Odea, MD  [facility] 11/17/2021, 2:31 PM

## 2021-11-17 NOTE — ED Notes (Signed)
Received patient In no acute distress breathing spontaneously to room air . Medicated as ordered, Denies chest pain or dizziness. Ambulates to restroom with steady gait . On continued cardiac monitoring.

## 2021-11-17 NOTE — ED Notes (Signed)
Patient up to bathroom at this time.

## 2021-11-17 NOTE — Consult Note (Signed)
[facility] CARDIOLOGY CONSULT NOTE       Patient ID: MAGGI HERSHKOWITZ MRN: [id number] DOB/AGE: Dec 26, 1950 71 y.o.  Admit date: 11/16/2021 Referring Physician Dr. Kandis Cocking Primary Physician Dr. Ramonita Lab  Primary Cardiologist Dr. Nehemiah Massed Reason for Consultation atrial flutter with 2:1 block   HPI: Jane Moran is a 82yoF with a PMH of paroxysmal atrial fibrillation on flecainide, metoprolol, and Eliquis, hypertension, OSA, hyperlipidemia, history of CVA, diabetes who presented to [facility] ED 11/16/2021 with a near syncopal episode.  Cardiology is consulted for further assistance.  The patient states she was at the dentist office yesterday afternoon where she was laying flat for about an hour while they put a crown on her tooth.  She tolerated the procedure well but when she sat up from a laying position she felt a little bit dizzy but it initially went away.  When she stood up and tried to take a step forward she felt very "swimmy headed and nauseous and she fell down on her bottom, catching herself with her left arm but denies losing consciousness.  She laid on the floor for some time with staff at the dentist office and they recommended she present to the ED for further work-up.  Overnight she says she felt fairly fatigued and weak but without further episodes of dizziness or lightheadedness, or presyncope.  She says this is never happened to her before and admits compliance with her Eliquis 5 mg twice daily in addition to her other medicines.  She denies any chest pain surrounding that near syncopal episode, and has not had any shortness of breath, orthopnea, or leg swelling recently.  The only medication change she has had recently is starting on Ozempic by her PCP, with improvement of her A1c to 6.2%.  She has not been hypotensive since presentation, nor hypoglycemic.  She was slightly bradycardic with heart rate 58-59 and atrial fibrillation with slow ventricular response on arrival, converting  to atrial flutter with variable block with rate 92 on repeat, and this morning is back in sinus rhythm with first-degree AV block with rate 70s.  Labs are notable for potassium initially 3.2, improving to 4.1, magnesium 2.0.  Creatinine 0.59, GFR greater than 60.  High-sensitivity troponin negative at 5-11.  Review of systems complete and found to be negative unless listed above     Past Medical History:  Diagnosis Date   Arthritis    Benign breast lumps    Cancer ([place])    skin   Complication of anesthesia    CVA (cerebral vascular accident) ([place])    Diabetes mellitus without complication ([place])    Diffuse cystic mastopathy    Dysrhythmia    Eczema    Esophageal reflux    Family history of malignant neoplasm of breast    H/O cystitis    H/O endoscopy 2012   Headache    migranes   Hemorrhoid    Hyperglycemia    Hyperlipemia    Hypertension 1990   Migraines    Paroxysmal A-fib (HCC)    PONV (postoperative nausea and vomiting)    Pre-diabetes    Screening cholesterol level    Screening for obesity    Sleep apnea    Special screening for malignant neoplasms, colon    Tubular adenoma of colon    Vitamin D deficiency     Past Surgical History:  Procedure Laterality Date   BREAST BIOPSY Left 2011   benign biopsy with Dr. Jamal Collin   BREAST BIOPSY  BREAST SURGERY     breast biopsy   BUNIONECTOMY  1972   BUNIONECTOMY     COLONOSCOPY  2007   COLONOSCOPY WITH PROPOFOL N/A 11/13/2014   Procedure: COLONOSCOPY WITH PROPOFOL;  Surgeon: Josefine Class, MD;  Location: [facility] ENDOSCOPY;  Service: Endoscopy;  Laterality: N/A;   COLONOSCOPY WITH PROPOFOL N/A 03/05/2018   Procedure: COLONOSCOPY WITH PROPOFOL;  Surgeon: Manya Silvas, MD;  Location: [facility] ENDOSCOPY;  Service: Endoscopy;  Laterality: N/A;   COLONOSCOPY WITH PROPOFOL N/A 04/01/2020   Procedure: COLONOSCOPY WITH PROPOFOL;  Surgeon: Toledo, Benay Pike, MD;  Location: [facility] ENDOSCOPY;  Service: Gastroenterology;   Laterality: N/A;  COVID POSITIVE ON 9/30 AT [facility]   CYST REMOVAL NECK  [id number]   CYSTECTOMY     ESOPHAGOGASTRODUODENOSCOPY (EGD) WITH PROPOFOL N/A 12/16/2016   Procedure: ESOPHAGOGASTRODUODENOSCOPY (EGD) WITH PROPOFOL;  Surgeon: Manya Silvas, MD;  Location: [facility] ENDOSCOPY;  Service: Endoscopy;  Laterality: N/A;   ESOPHAGOGASTRODUODENOSCOPY (EGD) WITH PROPOFOL N/A 04/01/2020   Procedure: ESOPHAGOGASTRODUODENOSCOPY (EGD) WITH PROPOFOL;  Surgeon: Toledo, Benay Pike, MD;  Location: [facility] ENDOSCOPY;  Service: Gastroenterology;  Laterality: N/A;   FOOT SURGERY     KNEE ARTHROSCOPY     KNEE SURGERY Left 2011   LIPOMA EXCISION     bvack of neck    WISDOM TOOTH EXTRACTION      (Not in a hospital admission)  Social History   Socioeconomic History   Marital status: Widowed    Spouse name: Not on file   Number of children: Not on file   Years of education: Not on file   Highest education level: Not on file  Occupational History   Not on file  Tobacco Use   Smoking status: Never   Smokeless tobacco: Never  Vaping Use   Vaping Use: Never used  Substance and Sexual Activity   Alcohol use: No   Drug use: No   Sexual activity: Not on file  Other Topics Concern   Not on file  Social History Narrative   Not on file   Social Determinants of Health   Financial Resource Strain: Not on file  Food Insecurity: Not on file  Transportation Needs: Not on file  Physical Activity: Not on file  Stress: Not on file  Social Connections: Not on file  Intimate Partner Violence: Not on file    Family History  Problem Relation Age of Onset   Breast cancer Mother 43   Lung cancer Father    Prostate cancer Maternal Grandfather    Breast cancer Paternal Aunt    Breast cancer Cousin        paternal side      PHYSICAL EXAM General: Pleasant Caucasian female, well nourished, in no acute distress. HEENT:  Normocephalic and atraumatic. Neck:  No JVD.  Lungs: Normal respiratory  effort on room air. Clear bilaterally to auscultation. No wheezes, crackles, rhonchi.  Heart: HRRR . Normal S1 and S2 without gallops or murmurs. Radial & DP pulses 2+ bilaterally. Abdomen: Obese appearing.  Msk: Normal strength and tone for age. Extremities: Warm and well perfused. No clubbing, cyanosis.  No peripheral edema.  Neuro: Alert and oriented X 3. Psych:  Answers questions appropriately.   Labs:   Lab Results  Component Value Date   WBC 12.9 (H) 11/17/2021   HGB 11.2 (L) 11/17/2021   HCT 37.6 11/17/2021   MCV 87.6 11/17/2021   PLT 333 11/17/2021    Recent Labs  Lab 11/16/21 1639 11/17/21 0520  NA  141 141  K 3.2* 4.1  CL 105 104  CO2 25 24  BUN 15 13  CREATININE 0.60 0.59  CALCIUM 9.5 9.4  PROT 6.8  --   BILITOT 0.6  --   ALKPHOS 56  --   ALT 28  --   AST 29  --   GLUCOSE 156* 103*   Lab Results  Component Value Date   TROPONINI <0.03 07/02/2018    Lab Results  Component Value Date   CHOL 201 (H) 07/02/2018   Lab Results  Component Value Date   HDL 47 07/02/2018   Lab Results  Component Value Date   LDLCALC 92 07/02/2018   Lab Results  Component Value Date   TRIG 310 (H) 07/02/2018   Lab Results  Component Value Date   CHOLHDL 4.3 07/02/2018   No results found for: "LDLDIRECT"    Radiology: No results found.  ECHO 12/13/2019  ECHOCARDIOGRAPHIC MEASUREMENTS  2D DIMENSIONS  AORTA                  Values   Normal Range   MAIN PA         Values    Normal Range                Annulus: 1.8 cm       [2.1-2.5]         PA Main: nm*       [1.5-2.1]              Aorta Sin: 3.2 cm       [2.7-3.3]    RIGHT VENTRICLE            ST Junction: nm*          [2.3-2.9]         RV Base: nm*       [<4.2]              Asc.Aorta: nm*          [2.3-3.1]          RV Mid: 3.3 cm    [<3.5]  LEFT VENTRICLE                                      RV Length: nm*       [<8.6]                  LVIDd: 5.2 cm       [3.9-5.3]    INFERIOR VENA CAVA                  LVIDs:  3.9 cm                        Max. IVC: nm*       [<=2.1]                     FS: 25.7 %       [>25]            Min. IVC: nm*                    SWT: 1.2 cm       [0.5-0.9]    ------------------                    PWT: 1.1 cm       [  0.5-0.9]    nm* - not measured  LEFT ATRIUM                LA Diam: 3.9 cm       [2.7-3.8]            LA A4C Area: nm*          [<20]              LA Volume: nm*          [22-52]  _________________________________________________________________________________________  ECHOCARDIOGRAPHIC DESCRIPTIONS  AORTIC ROOT                   Size: Normal             Dissection: INDETERM FOR DISSECTION  AORTIC VALVE               Leaflets: Tricuspid                   Morphology: Normal               Mobility: Fully mobile  LEFT VENTRICLE                   Size: Normal                        Anterior: Normal            Contraction: Normal                         Lateral: Normal             Closest EF: 50% (Estimated)                 Septal: Normal              LV Masses: No Masses                       Apical: Normal                    LVH: MILD LVH                      Inferior: Normal                                                      Posterior: Normal           Dias.FxClass: N/A  MITRAL VALVE               Leaflets: Normal                        Mobility: Fully mobile             Morphology: Normal  LEFT ATRIUM                   Size: Normal                       LA Masses: No masses              IA Septum: Normal IAS  MAIN PA  Size: Normal  PULMONIC VALVE             Morphology: Normal                        Mobility: Fully mobile  RIGHT VENTRICLE              RV Masses: No Masses                         Size: Normal              Free Wall: Normal                     Contraction: Normal  TRICUSPID VALVE               Leaflets: Normal                        Mobility: Fully mobile             Morphology: Normal  RIGHT ATRIUM                    Size: Normal                        RA Other: None                RA Mass: No masses  PERICARDIUM                  Fluid: No effusion  INFERIOR VENACAVA                   Size: Normal Normal respiratory collapse  _________________________________________________________________________________________   DOPPLER ECHO and OTHER SPECIAL PROCEDURES                 Aortic: No AR                      No AS                         99.1 cm/sec peak vel       3.9 mmHg peak grad                 Mitral: TRIVIAL MR                 No MS                         MV Inflow E Vel = 102.0 cm/sec      MV Annulus E'Vel = 7.2 cm/sec                         E/E'Ratio = 14.2              Tricuspid: TRIVIAL TR                 No TS              Pulmonary: TRIVIAL PR                 No PS                         66.5 cm/sec peak vel  1.8 mmHg peak grad  _________________________________________________________________________________________  INTERPRETATION  NORMAL LEFT VENTRICULAR SYSTOLIC FUNCTION   WITH MILD LVH  NORMAL RIGHT VENTRICULAR SYSTOLIC FUNCTION  TRIVIAL REGURGITATION NOTED (See above)  NO VALVULAR STENOSIS  TRIVIAL MR, TR, PR  EF 50-55%  _______________________________________________________  TELEMETRY reviewed by me: Sinus rhythm first-degree AV block 60s to 70s  EKG reviewed by me: initally AF with slow response, rate 59, repeat atrial flutter with variable block rate 92, third repeat this morning sinus rhythm with first-degree AV block 70  ASSESSMENT AND PLAN:  Rhen Dossantos is a 71yoF with a PMH of paroxysmal atrial fibrillation on flecainide, metoprolol, and Eliquis, hypertension, OSA, hyperlipidemia, history of CVA, diabetes who presented to [facility] ED 11/16/2021 with a near syncopal episode.  Cardiology is consulted for further assistance.  #Presyncope #Paroxysmal atrial fibrillation/flutter with variable block, now converted to sinus rhythm The patient presented after a near  syncopal episode after having a crown placed at the dentist office.  She was laying flat for an hour and upon sitting up from a laying position she felt dizzy, and eventually felt "swimmy headed" and nauseous upon standing and trying to ambulate.  She fell on her bottom and caught herself with her left arm and laid on the ground with eventual improvement in her symptoms and presented to the ED at the request of the dentist office staff.  She was initially in atrial fibrillation with slow ventricular response on presentation with rate 58, converted to a flutter with variable block with rate in the 90s, and then has been in sinus rhythm with rate in the high 60s to 70s throughout the morning hours.  I suspect this presyncopal episode is primarily vasovagal in nature. -Continuous monitoring on telemetry while inpatient -Monitor and replete electrolytes as needed -Continue  flecainide 100 mg twice daily, losartan 50 mg daily, HCTZ 12.5 mg daily -Continue Eliquis 5 mg twice daily for stroke risk reduction -Hold metoprolol tartrate for this morning, restart this at a decreased dose of 12.5 mg twice daily at discharge -She is okay for discharge from a cardiac standpoint later today provided she is ambulating well without further episodes of presyncope.  She will need follow-up with Dr. Nehemiah Massed in 1 to 2 weeks.  This patient's plan of care was discussed and created with Dr. Nehemiah Massed and he is in agreement.  Signed: Tristan Schroeder , PA-C 11/17/2021, 8:57 AM [facility]

## 2021-11-17 NOTE — ED Notes (Signed)
BS was 121 notified nurse

## 2021-11-17 NOTE — ED Notes (Signed)
Patients BS was 126 notified nurse Matthew at this time.

## 2022-02-07 ENCOUNTER — Ambulatory Visit (LOCAL_COMMUNITY_HEALTH_CENTER): Payer: Medicare Other

## 2022-02-07 DIAGNOSIS — Z719 Counseling, unspecified: Secondary | ICD-10-CM

## 2022-02-07 DIAGNOSIS — Z23 Encounter for immunization: Secondary | ICD-10-CM | POA: Diagnosis not present

## 2022-02-07 NOTE — Progress Notes (Signed)
  Are you feeling sick today? No   Have you ever received a dose of COVID-19 Vaccine? [employer], [place], [place], [place], Other) Yes  If yes, which vaccine and how many doses?   5 doses Pfizer   Did you bring the vaccination record card or other documentation?  Yes   Do you have a health condition or are undergoing treatment that makes you moderately or severely immunocompromised? This would include, but not be limited to: cancer, HIV, organ transplant, immunosuppressive therapy/high-dose corticosteroids, or moderate/severe primary immunodeficiency.  No  Have you received COVID-19 vaccine before or during hematopoietic cell transplant (HCT) or CAR-T-cell therapies? No  Have you ever had an allergic reaction to: (This would include a severe allergic reaction or a reaction that caused hives, swelling, or respiratory distress, including wheezing.) A component of a COVID-19 vaccine or a previous dose of COVID-19 vaccine? No   Have you ever had an allergic reaction to another vaccine (other thanCOVID-19 vaccine) or an injectable medication? (This would include a severe allergic reaction or a reaction that caused hives, swelling, or respiratory distress, including wheezing.)   No    Do you have a history of any of the following:  Myocarditis or Pericarditis No  Dermal fillers:  No  Multisystem Inflammatory Syndrome (MIS-C or MIS-A)? No  COVID-19 disease within the past 3 months? No  Vaccinated with monkeypox vaccine in the last 4 weeks? No   COVID vaccine - Comirnaty given in Left deltoid.  Tolerated well. VIS provided.  COVID card updated and [facility] updated and copy provided.

## 2022-12-13 ENCOUNTER — Other Ambulatory Visit: Payer: Self-pay | Admitting: Internal Medicine

## 2022-12-13 DIAGNOSIS — Z1231 Encounter for screening mammogram for malignant neoplasm of breast: Secondary | ICD-10-CM

## 2022-12-20 ENCOUNTER — Ambulatory Visit
Admission: RE | Admit: 2022-12-20 | Discharge: 2022-12-20 | Disposition: A | Discharge status: Home / Self Care | Payer: Medicare Other | Source: Ambulatory Visit | Attending: Internal Medicine | Admitting: Internal Medicine

## 2022-12-20 DIAGNOSIS — Z1231 Encounter for screening mammogram for malignant neoplasm of breast: Secondary | ICD-10-CM | POA: Diagnosis present

## 2024-01-01 ENCOUNTER — Other Ambulatory Visit: Payer: Self-pay | Admitting: Internal Medicine

## 2024-01-01 DIAGNOSIS — Z1231 Encounter for screening mammogram for malignant neoplasm of breast: Secondary | ICD-10-CM

## 2024-01-02 NOTE — Progress Notes (Signed)
 Subjective:   CC: Epidermal cyst [L72.0]  HPI:  referred by Ophelia Marinell Fernande DOUGLAS, MD for evaluation of above.   History of Present Illness Jane Moran is a 73 year old female with a history of squamous cell carcinoma and melanoma who presents with a growing lesion on her left forearm. She is seeking a second opinion from a surgeon because her dermatologist has not addressed her concerns about the lesion.  She regularly undergoes excisions and biopsies for squamous cell carcinoma and melanoma, primarily on her forearms and back. The new lesion on her left forearm is a pearly white, flesh-colored, symmetric circle with smooth edges, measuring four to five millimeters. It began as a small bubble and has grown. The lesion is itchy and cosmetically bothersome, but it has not changed in shape or color and does not bleed.    Past Medical History:  has a past medical history of Arthritis, Benign breast lumps, Chickenpox, COVID-19 (12/2019), Diabetes mellitus without complication (CMS/HHS-HCC), Eczema, unspecified, GERD (gastroesophageal reflux disease), Hemorrhoids, History of CVA (cerebrovascular accident) (07/03/2018), Hyperglycemia (03/18/2014), Hyperlipidemia, Hypertension, Internal hemorrhoids (11/13/2014), Migraines, OSA (obstructive sleep apnea) (09/17/2018), Paroxysmal atrial fibrillation (CMS/HHS-HCC) (07/03/2018), Tubular adenoma of colon (11/13/2014), and Vitamin D  deficiency (09/16/2013).  Past Surgical History:  has a past surgical history that includes Foot surgery (Right, 1973); Lipoma removed; S/P breast biopsy; Wisdom teeth removed; Knee arthroscopy (Left, 03/08/2010); Cystectomy (Right, 11/2013); egd (08/12/2011, 12/17/2010); Colonoscopy (08/05/2004); Colonoscopy (11/13/2014); egd (12/16/2016); Colonoscopy (03/05/2018); Colonoscopy (04/01/2020); and egd (04/01/2020).  Family History: family history includes Breast cancer in her mother; Colon polyps in her mother; Coronary Artery Disease (Blocked  arteries around heart) in her mother; High blood pressure (Hypertension) in her mother and sister; Hyperlipidemia (Elevated cholesterol) in her mother; Lung cancer in her father; Myocardial Infarction (Heart attack) in her mother; Pacemaker in her sister.  Social History:  reports that she has never smoked. She has never used smokeless tobacco. She reports that she does not drink alcohol and does not use drugs.  Current Medications: has a current medication list which includes the following prescription(s): apixaban , atorvastatin , calcium  carbonate/vitamin d3, cholecalciferol , cyanocobalamin, dexlansoprazole, flecainide , hydrochlorothiazide , iron,carbonyl/ascorbic acid, lactobacillus acidophilus, losartan , meclizine, metformin , metoprolol  tartrate, niacin , omega-3 fatty acids-fish oil, tirzepatide, blood glucose diagnostic, and blood glucose meter.  Allergies:  Allergies  Allergen Reactions  . Codeine Phosphate Nausea  . Zinc Rash    ROS:  A 15 point review of systems was performed and pertinent positives and negatives noted in HPI   Objective:     BP 133/77   Pulse 65   Ht 157.5 cm (5' 2)   Wt (!) 105.2 kg (232 lb)   BMI 42.43 kg/m   Constitutional :  No distress, cooperative, alert  Lymphatics/Throat:  Supple with no lymphadenopathy  Respiratory:  Clear to auscultation bilaterally  Cardiovascular:  Regular rate and rhythm  Gastrointestinal: Soft, non-tender, non-distended, no organomegaly.  Musculoskeletal: Steady gait and movement  Skin: Cool and moist. Left forearm, palmar aspect with 1cm x 1cm smooth, mobile nodule with no TTP.  Small comedo type pit at apex, likely epidermal cyst.    Psychiatric: Normal affect, non-agitated, not confused         LABS:  N/a   RADS: N/a  Assessment:      Epidermal cyst [L72.0]  Plan:     1. Epidermal cyst [L72.0] Discussed surgical excision.  Alternatives include continued observation.  Benefits include possible symptom relief,  pathologic evaluation, improved cosmesis. Discussed the risk of surgery  including recurrence, chronic pain, post-op infxn, poor cosmesis, poor/delayed wound healing, and possible re-operation to address said risks. The risks of general anesthetic, if used, includes MI, CVA, sudden death or even reaction to anesthetic medications also discussed.  Typical post-op recovery time of 3-5 days with possible activity restrictions were also discussed.  The patient verbalized understanding and all questions were answered to the patient's satisfaction.  2. Patient has elected to proceed with surgical treatment. Procedure will be scheduled. In office.  Hold eliquis .  labs/images/medications/previous chart entries reviewed personally and relevant changes/updates noted above.

## 2024-01-19 ENCOUNTER — Ambulatory Visit
Admission: RE | Admit: 2024-01-19 | Discharge: 2024-01-19 | Disposition: A | Discharge status: Home / Self Care | Source: Ambulatory Visit | Attending: Internal Medicine | Admitting: Internal Medicine

## 2024-01-19 DIAGNOSIS — Z1231 Encounter for screening mammogram for malignant neoplasm of breast: Secondary | ICD-10-CM | POA: Insufficient documentation
